# Patient Record
Sex: Female | Born: 1965 | Hispanic: No | Smoking: Former smoker
Health system: Southern US, Community
[De-identification: ages and names within clinical notes are randomized; demographics above are authoritative.]

## PROBLEM LIST (undated history)

## (undated) DIAGNOSIS — I82409 Acute embolism and thrombosis of unspecified deep veins of unspecified lower extremity: Secondary | ICD-10-CM

## (undated) DIAGNOSIS — D649 Anemia, unspecified: Secondary | ICD-10-CM

## (undated) HISTORY — PX: APPENDECTOMY: SHX54

---

## 2008-02-05 ENCOUNTER — Emergency Department (HOSPITAL_COMMUNITY): Admission: EM | Admit: 2008-02-05 | Discharge: 2008-02-05 | Payer: Self-pay | Admitting: Emergency Medicine

## 2008-04-25 DIAGNOSIS — Z86718 Personal history of other venous thrombosis and embolism: Secondary | ICD-10-CM

## 2008-04-26 ENCOUNTER — Inpatient Hospital Stay (HOSPITAL_COMMUNITY): Admission: EM | Admit: 2008-04-26 | Discharge: 2008-05-01 | Payer: Self-pay | Admitting: Emergency Medicine

## 2008-05-11 ENCOUNTER — Ambulatory Visit: Payer: Self-pay | Admitting: Nurse Practitioner

## 2008-05-11 DIAGNOSIS — D649 Anemia, unspecified: Secondary | ICD-10-CM | POA: Insufficient documentation

## 2008-05-11 DIAGNOSIS — Z8672 Personal history of thrombophlebitis: Secondary | ICD-10-CM | POA: Insufficient documentation

## 2008-05-12 LAB — CONVERTED CEMR LAB
Basophils Absolute: 0 10*3/uL (ref 0.0–0.1)
Eosinophils Absolute: 0.1 10*3/uL (ref 0.0–0.7)
Eosinophils Relative: 2 % (ref 0–5)
HCT: 34.6 % — ABNORMAL LOW (ref 36.0–46.0)
Hemoglobin: 11.1 g/dL — ABNORMAL LOW (ref 12.0–15.0)
Lymphocytes Relative: 42 % (ref 12–46)
MCV: 88.5 fL (ref 78.0–100.0)
Monocytes Absolute: 0.3 10*3/uL (ref 0.1–1.0)
Platelets: 293 10*3/uL (ref 150–400)
Prothrombin Time: 13.5 s (ref 11.6–15.2)
RDW: 14.9 % (ref 11.5–15.5)

## 2008-05-25 ENCOUNTER — Ambulatory Visit: Payer: Self-pay | Admitting: Nurse Practitioner

## 2008-05-26 LAB — CONVERTED CEMR LAB
INR: 1 (ref 0.0–1.5)
Prothrombin Time: 13.6 s (ref 11.6–15.2)

## 2008-06-27 ENCOUNTER — Telehealth (INDEPENDENT_AMBULATORY_CARE_PROVIDER_SITE_OTHER): Payer: Self-pay | Admitting: Nurse Practitioner

## 2008-07-04 ENCOUNTER — Encounter (INDEPENDENT_AMBULATORY_CARE_PROVIDER_SITE_OTHER): Payer: Self-pay | Admitting: *Deleted

## 2008-07-10 ENCOUNTER — Ambulatory Visit: Payer: Self-pay | Admitting: Nurse Practitioner

## 2008-07-10 DIAGNOSIS — F4322 Adjustment disorder with anxiety: Secondary | ICD-10-CM | POA: Insufficient documentation

## 2008-07-10 DIAGNOSIS — R350 Frequency of micturition: Secondary | ICD-10-CM | POA: Insufficient documentation

## 2008-07-10 LAB — CONVERTED CEMR LAB
Bilirubin Urine: NEGATIVE
Blood in Urine, dipstick: NEGATIVE
Ketones, urine, test strip: NEGATIVE
Urobilinogen, UA: NEGATIVE
pH: 7.5

## 2008-07-11 ENCOUNTER — Encounter (INDEPENDENT_AMBULATORY_CARE_PROVIDER_SITE_OTHER): Payer: Self-pay | Admitting: Nurse Practitioner

## 2008-07-13 ENCOUNTER — Encounter (INDEPENDENT_AMBULATORY_CARE_PROVIDER_SITE_OTHER): Payer: Self-pay | Admitting: Nurse Practitioner

## 2008-07-21 ENCOUNTER — Ambulatory Visit: Payer: Self-pay | Admitting: Oncology

## 2008-08-11 ENCOUNTER — Ambulatory Visit: Payer: Self-pay | Admitting: Internal Medicine

## 2008-08-30 ENCOUNTER — Telehealth (INDEPENDENT_AMBULATORY_CARE_PROVIDER_SITE_OTHER): Payer: Self-pay | Admitting: Nurse Practitioner

## 2008-12-28 ENCOUNTER — Telehealth (INDEPENDENT_AMBULATORY_CARE_PROVIDER_SITE_OTHER): Payer: Self-pay | Admitting: Nurse Practitioner

## 2009-03-13 ENCOUNTER — Emergency Department (HOSPITAL_COMMUNITY): Admission: EM | Admit: 2009-03-13 | Discharge: 2009-03-13 | Payer: Self-pay | Admitting: Emergency Medicine

## 2009-12-19 ENCOUNTER — Ambulatory Visit: Payer: Self-pay | Admitting: Nurse Practitioner

## 2009-12-19 DIAGNOSIS — F1910 Other psychoactive substance abuse, uncomplicated: Secondary | ICD-10-CM | POA: Insufficient documentation

## 2009-12-19 DIAGNOSIS — A6000 Herpesviral infection of urogenital system, unspecified: Secondary | ICD-10-CM | POA: Insufficient documentation

## 2009-12-19 LAB — CONVERTED CEMR LAB
Glucose, Urine, Semiquant: NEGATIVE
Ketones, urine, test strip: NEGATIVE
Protein, U semiquant: NEGATIVE
Specific Gravity, Urine: 1.02
pH: 6.5

## 2009-12-20 ENCOUNTER — Encounter (INDEPENDENT_AMBULATORY_CARE_PROVIDER_SITE_OTHER): Payer: Self-pay | Admitting: Nurse Practitioner

## 2009-12-20 LAB — CONVERTED CEMR LAB
Albumin: 4.3 g/dL (ref 3.5–5.2)
Alkaline Phosphatase: 34 units/L — ABNORMAL LOW (ref 39–117)
CO2: 22 meq/L (ref 19–32)
Calcium: 9.2 mg/dL (ref 8.4–10.5)
Chloride: 107 meq/L (ref 96–112)
Glucose, Bld: 94 mg/dL (ref 70–99)
HCT: 35.6 % — ABNORMAL LOW (ref 36.0–46.0)
Hemoglobin: 11.9 g/dL — ABNORMAL LOW (ref 12.0–15.0)
LDL Cholesterol: 111 mg/dL — ABNORMAL HIGH (ref 0–99)
Lymphocytes Relative: 40 % (ref 12–46)
Lymphs Abs: 1.5 10*3/uL (ref 0.7–4.0)
Monocytes Absolute: 0.3 10*3/uL (ref 0.1–1.0)
Monocytes Relative: 8 % (ref 3–12)
Neutro Abs: 1.9 10*3/uL (ref 1.7–7.7)
Potassium: 5.3 meq/L (ref 3.5–5.3)
RBC: 4.12 M/uL (ref 3.87–5.11)
Sodium: 140 meq/L (ref 135–145)
Total Protein: 6.9 g/dL (ref 6.0–8.3)
Triglycerides: 49 mg/dL (ref <150)

## 2009-12-28 ENCOUNTER — Encounter (INDEPENDENT_AMBULATORY_CARE_PROVIDER_SITE_OTHER): Payer: Self-pay | Admitting: *Deleted

## 2010-02-26 ENCOUNTER — Ambulatory Visit: Payer: Self-pay | Admitting: Nurse Practitioner

## 2010-02-26 ENCOUNTER — Other Ambulatory Visit: Admission: RE | Admit: 2010-02-26 | Discharge: 2010-02-26 | Payer: Self-pay | Admitting: Internal Medicine

## 2010-02-26 DIAGNOSIS — D72819 Decreased white blood cell count, unspecified: Secondary | ICD-10-CM | POA: Insufficient documentation

## 2010-02-26 DIAGNOSIS — F329 Major depressive disorder, single episode, unspecified: Secondary | ICD-10-CM

## 2010-02-26 DIAGNOSIS — N926 Irregular menstruation, unspecified: Secondary | ICD-10-CM | POA: Insufficient documentation

## 2010-02-26 DIAGNOSIS — E669 Obesity, unspecified: Secondary | ICD-10-CM | POA: Insufficient documentation

## 2010-02-26 LAB — CONVERTED CEMR LAB
Bilirubin Urine: NEGATIVE
KOH Prep: NEGATIVE
Ketones, urine, test strip: NEGATIVE
Specific Gravity, Urine: 1.015
Urobilinogen, UA: 0.2

## 2010-02-27 LAB — CONVERTED CEMR LAB
Basophils Relative: 1 % (ref 0–1)
Eosinophils Absolute: 0.1 10*3/uL (ref 0.0–0.7)
MCHC: 33.4 g/dL (ref 30.0–36.0)
MCV: 84.2 fL (ref 78.0–100.0)
Neutrophils Relative %: 54 % (ref 43–77)
OCCULT 1: NEGATIVE
Platelets: 209 10*3/uL (ref 150–400)
WBC: 4.5 10*3/uL (ref 4.0–10.5)

## 2010-03-01 ENCOUNTER — Encounter (INDEPENDENT_AMBULATORY_CARE_PROVIDER_SITE_OTHER): Payer: Self-pay | Admitting: Nurse Practitioner

## 2010-03-13 ENCOUNTER — Telehealth (INDEPENDENT_AMBULATORY_CARE_PROVIDER_SITE_OTHER): Payer: Self-pay | Admitting: Nurse Practitioner

## 2010-03-15 ENCOUNTER — Encounter (INDEPENDENT_AMBULATORY_CARE_PROVIDER_SITE_OTHER): Payer: Self-pay | Admitting: *Deleted

## 2010-04-03 ENCOUNTER — Emergency Department (HOSPITAL_COMMUNITY)
Admission: EM | Admit: 2010-04-03 | Discharge: 2010-04-04 | Payer: Self-pay | Source: Home / Self Care | Admitting: Emergency Medicine

## 2010-09-19 ENCOUNTER — Ambulatory Visit: Payer: Self-pay | Admitting: Nurse Practitioner

## 2010-09-19 DIAGNOSIS — N760 Acute vaginitis: Secondary | ICD-10-CM | POA: Insufficient documentation

## 2010-09-19 LAB — CONVERTED CEMR LAB
Protein, U semiquant: NEGATIVE
Urobilinogen, UA: 0.2

## 2010-10-20 ENCOUNTER — Encounter: Payer: Self-pay | Admitting: Internal Medicine

## 2010-10-29 ENCOUNTER — Ambulatory Visit (HOSPITAL_COMMUNITY)
Admission: RE | Admit: 2010-10-29 | Discharge: 2010-10-29 | Payer: Self-pay | Source: Home / Self Care | Attending: Internal Medicine | Admitting: Internal Medicine

## 2010-10-29 NOTE — Letter (Signed)
Summary: *HSN Results Follow up  [facility]  [address] [place], [place] [address]   Phone: [phone]  Fax: [phone]      12/28/2009   Molly Goodman [address] [place], [place]  [address]   Dear  [name],                            ____S.Drinkard,FNP   ____D. Gore,FNP       ____B. McPherson,MD   ____V. Rankins,MD    ____E. Mulberry,MD    ____N. Daphine Deutscher, FNP  ____D. Reche Dixon, MD    ____K. Philipp Deputy, MD    ____Other     This letter is to inform you that your recent test(s):  _______Pap Smear    _______Lab Test     _______X-ray    _______ is within acceptable limits  ___X____ requires a medication change  _______ requires a follow-up lab visit  _______ requires a follow-up visit with your provider   Comments:  We have been trying to reach you at [phone].  Please contact the office at your earliest convenience.       _________________________________________________________ If you have any questions, please contact our office                     Sincerely,  Molly Goodman [facility]            Appended Document: Needs repeat UA Left message on answering machine for pt. to return call.  Pt. did not receive Rx for Cipro.  Will attempt to contact pt. for repeat UA.  Dutch Quint RN  Feb 14, 2010 9:52 AM  Noted n.martin,fnp  Feb 14, 2010  10:16 AM  Pt. states that she has no residual symptoms of UTI and sees no need for follow-up.  Declined repeat UA. Dutch Quint RN  Feb 15, 2010 2:38 PM    Clinical Lists Changes

## 2010-10-29 NOTE — Progress Notes (Signed)
Summary: Office Visit//DEPRESSION SCREENING  Office Visit//DEPRESSION SCREENING   Imported By: Arta Bruce 02/28/2010 11:02:19  _____________________________________________________________________  External Attachment:    Type:   Image     Comment:   External Document

## 2010-10-29 NOTE — Letter (Signed)
Summary: Generic Letter  [facility]  [address] [place], [place] [address]   Phone: [phone]  Fax: [phone]    03/15/2010  Molly Goodman [address] [place], [place]  [address]  Dear [name],  We have been unable to contact you by telephone.  Please call our office, at your earliest convenience, so that we may speak with you.  Sincerely,   Dutch Quint RN

## 2010-10-29 NOTE — Assessment & Plan Note (Signed)
Summary: Cystitis   Vital Signs:  Patient profile:   45 year old female LMP:     11/2009 Height:      69 inches Weight:      174.7 pounds BMI:     25.89 BSA:     1.95 Temp:     98.3 degrees F oral Pulse rate:   134 / minute Pulse rhythm:   regular Resp:     20 per minute BP sitting:   92 / 61  (left arm) Cuff size:   regular  Vitals Entered By: Levon Hedger (December 19, 2009 9:34 AM) CC: pt last seen in 2009.Marland KitchenMarland Kitchenpossible bladder infection....need meds for herpes outbreak.Marland KitchenMarland Kitchenpt has not taken lovanox for a long time now for blood clots Is Patient Diabetic? No Pain Assessment Patient in pain? no       Does patient need assistance? Functional Status Self care Ambulation Normal Comments pt is not taking any medications daily. LMP (date): 11/2009     Enter LMP: 11/2009   CC:  pt last seen in 2009.Marland KitchenMarland Kitchenpossible bladder infection....need meds for herpes outbreak.Marland KitchenMarland Kitchenpt has not taken lovanox for a long time now for blood clots.  History of Present Illness:  Pt into the office for follow up.  Pt has not been in the ER or urgent care recently. No PCP since her last visit here in 2009.  Urine problems - 2 weeks ago she has noticed that her urine is very dark and discolored. Admits that she does not drink much water minimal dysuria no hematuria Monthly monthly periods.  Last menses last week.  S/p Tubal ligation. Hx of herpes - No current prophylactic meds.   Previous outbreaks every 2 years.  Now she has noticed that she is having more frequent flares.  Stress levels have also increased. Abstinent since 06/2009  Hx of drug use - Despite several setbacks pt has been clean and sober since 07/2009.  Hx DVT and PE in 2009.  Pt did not take lovenox as ordered. Prior to last episode she was on coumadin and was again non-complaint. She has not taken any anti-coagulation medication in over 1 year.  no further problems.  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 Other family members have a history of blood clots as well.  She was not able to get a greenfield filter in 2009 due to enlarged IVC and risk for filter migration (also using cocaine at that time)   Quit Cocaine use in 07/2009  Social - pt is living in her own apartment. She has both of her children - one is a Printmaker in college and the other is 43 yrs old. She is currently unemployed.  Fasting today  Habits & Providers  Alcohol-Tobacco-Diet     Alcohol drinks/day: <1     Alcohol type: wine     Tobacco Status: never  Exercise-Depression-Behavior     Does Patient Exercise: no     Have you felt down or hopeless? no     Have you felt little pleasure in things? no     Depression Counseling: not indicated; screening negative for depression     Drug Use: past     Drug Use Counseling: quit november 2010     Seat Belt Use: 100     Sun Exposure: occasionally  Allergies (verified): No Known Drug Allergies  Social History: Drug Use:  past  Review of Systems CV:  Denies chest pain or discomfort. Resp:  Denies cough. GI:  Denies abdominal pain, nausea, and vomiting. GU:  Complains of dysuria; denies discharge, incontinence, and nocturia.  Physical Exam  General:  alert.   Head:  normocephalic.   Lungs:  normal breath sounds.   Heart:  normal rate and regular rhythm.   Abdomen:  normal bowel sounds.   Msk:  normal ROM.   Neurologic:  alert & oriented X3 and cranial nerves II-XII intact.     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 s/p 3 episodes so would have likely been a lifelong candidate but given history of non- compliance with drug use she has not had any meds in over 1 year. She needs referral to hematology for hypercoagulable workup - will discuss on next visit The following medications were removed from the medication list:    Lovenox 60 Mg/0.2ml Soln (Enoxaparin sodium) .Marland Kitchen... 1 injection subcutaneously daily for 9 months  Complete Medication List: 1)  Macrobid 100 Mg Caps (Nitrofurantoin monohyd macro) .Marland Kitchen.. 1 capsule by mouth two times a day for infection 2)  Valacyclovir Hcl 500 Mg Tabs (Valacyclovir hcl) .... One tablet by mouth daily  Other Orders: T-Lipid Profile ([id number]) T-TSH [phone])  Patient Instructions: 1)  Schedule an office visit for a complete physical exam. 2)  You will be notified of any abnormal labs 3)  Your urine will be sent for culture and you will be notified of the results.  Take macrobid 100mg  by mouth two times a day. 4)  Drink plenty of water. 5)  Discuss need for hematology referral for hypercoagulable workup Prescriptions: VALACYCLOVIR HCL 500 MG TABS (VALACYCLOVIR HCL) One tablet by mouth daily  #30 x 5   Entered and Authorized by:   Lehman Prom FNP   Signed by:   Lehman Prom FNP on 12/19/2009   Method used:   Print then Give to Patient   RxID:   [id number] MACROBID 100 MG CAPS (NITROFURANTOIN MONOHYD MACRO) 1 capsule by mouth two times a day for infection  #14 x 0   Entered and Authorized by:   Lehman Prom FNP   Signed by:   Lehman Prom FNP on 12/19/2009    Method used:   Print then Give to Patient   RxID:   [id number]   Laboratory Results   Urine Tests  Date/Time Received: December 19, 2009 9:50 AM   Routine Urinalysis   Color: lt. yellow Appearance: Clear Glucose: negative   (Normal Range: Negative) Bilirubin: negative   (Normal Range: Negative) Ketone: negative   (Normal Range: Negative) Spec. Gravity: 1.020   (Normal Range: 1.003-1.035) Blood: trace-lysed   (Normal Range: Negative) pH: 6.5   (Normal Range: 5.0-8.0) Protein: negative   (Normal Range: Negative) Urobilinogen: 0.2   (  Normal Range: 0-1) Nitrite: negative   (Normal Range: Negative) Leukocyte Esterace: negative   (Normal Range: Negative)       Appended Document: Cystitis  Laboratory Results  Date/Time Received: December 19, 2009 2:04 PM   Other Tests  Rapid HIV: negative

## 2010-10-29 NOTE — Progress Notes (Signed)
Summary: needs to pick up Rx  Phone Note Outgoing Call   Call placed by: Dutch Quint RN Call placed to: Patient Summary of Call: Did not pick up Rx for antibiotic for UTI -- needs to come in for a repeat UA to assess for need.  Left message with sister for pt. to return call. Dutch Quint RN  March 13, 2010 3:02 PM  Left message with daughter for pt. to return call.  Dutch Quint RN  March 14, 2010 10:33 AM  Unable to leave message for pt.  Letter sent.  Dutch Quint RN  March 15, 2010 12:20 PM

## 2010-10-29 NOTE — Assessment & Plan Note (Signed)
Summary: Complete Physical Exam   Vital Signs:  Patient profile:   45 year old female LMP:     12/2009 Weight:      180.0 pounds BMI:     26.68 BSA:     1.98 Temp:     98.4 degrees F oral Pulse rate:   66 / minute Pulse rhythm:   regular Resp:     20 per minute BP sitting:   104 / 68  (left arm) Cuff size:   regular  Vitals Entered By: Levon Hedger (Feb 26, 2010 11:44 AM)  Nutrition Counseling: Patient's BMI is greater than 25 and therefore counseled on weight management options. CC: Cpp, Depression Is Patient Diabetic? No Pain Assessment Patient in pain? no       Does patient need assistance? Functional Status Self care Ambulation Normal Comments mammogram scheduled for 03/06/10 @ 9:50 pt informed. LMP (date): 12/2009     Enter LMP: 12/2009   CC:  Cpp and Depression.  History of Present Illness:   PAP - last done over 2 year ago - all normal results No family history of cervical or ovarian cancer Menses - skipped last month but usually monthly  2 teenage children No current birth control due to hx of DVT Not sexually active at this time; currently separted from husband  Mammogram - maternal grandmother with breast cancer never had mammogram  Optho - wears glasses but does not wear like she should. Last eye exam 2 years ago  Dental - no recent dental exam but is aware that she need to make the appt     Depression History:      The patient presents with symptoms of depression which have been present for greater than two weeks.  The patient is having a depressed mood most of the day and has a diminished interest in her usual daily activities.  Positive alarm features for depression include fatigue (loss of energy).  However, she denies recurrent thoughts of death or suicide.        Psychosocial stress factors include major life changes.  The patient denies that she feels like life is not worth living, denies that she wishes that she were dead, and denies that  she has thought about ending her life.         Habits & Providers  Alcohol-Tobacco-Diet     Alcohol drinks/day: <1     Alcohol type: wine     Tobacco Status: never  Exercise-Depression-Behavior     Does Patient Exercise: no     Have you felt down or hopeless? yes     Have you felt little pleasure in things? yes     Depression Counseling: not indicated; screening negative for depression     Drug Use: past     Seat Belt Use: 100     Sun Exposure: occasionally  Comments: PHQ-9 score = 19  Medications Prior to Update: 1)  Macrobid 100 Mg Caps (Nitrofurantoin Monohyd Macro) .Marland Kitchen.. 1 Capsule By Mouth Two Times A Day For Infection 2)  Valacyclovir Hcl 500 Mg Tabs (Valacyclovir Hcl) .... One Tablet By Mouth Daily 3)  Multivitamins  Tabs (Multiple Vitamin) .... One Tablet By Mouth Daily  Allergies (verified): No Known Drug Allergies  Review of Systems General:  Denies fever. Eyes:  Denies blurring. ENT:  Denies earache. CV:  Denies chest pain or discomfort. Resp:  Denies cough. GI:  Denies abdominal pain, nausea, and vomiting. GU:  Denies discharge. MS:  Denies joint pain.  Derm:  Denies rash. Neuro:  Denies headaches. Psych:  Complains of depression.  Physical Exam  General:  alert.   Head:  normocephalic.   Eyes:  pupils round.   Ears:  minimal cerumenear piercing(s) noted.   Nose:  no nasal discharge.   Mouth:  pharynx pink and moist.   Neck:  supple.   Chest Wall:  no deformities.   Breasts:  skin/areolae normal and no masses.   Lungs:  normal breath sounds.   Heart:  normal rate and regular rhythm.   Abdomen:  soft, non-tender, and normal bowel sounds.   Rectal:  external hemorrhoid(s).    Pelvic Exam  Vulva:      normal appearance.   Urethra and Bladder:      Urethra--no discharge.   Vagina:      physiologic discharge.   Cervix:      midposition, nabothian cysts.   Uterus:      smooth.   Adnexa:      nontender bilaterally.   Rectum:      heme  negative stool, + external hemorrhoids.      Impression & Recommendations:  Problem # 1:  ROUTINE GYNECOLOGICAL EXAMINATION (ICD-V72.31) labs reviewed from previous visit PAP done rec optho and dental exam tdap given EKG done Orders: KOH/ WET Mount [phone]) Pap Smear, Thin Prep ( Collection of) (J8119) UA Dipstick w/o Micro (manual) (14782) Hemoccult Guaiac-1 spec.(in office) (82270) T- GC Chlamydia ([address])  Problem # 2:  UNSPECIFIED BREAST SCREENING (ICD-V76.10) mammogram ordered self breast exam encouraged Orders: Mammogram (Screening) (Mammo)  Problem # 3:  LEUKOPENIA, MILD (ICD-288.50) will recheck today previous hx of ETOH abuse but not currenty Orders: T-CBC w/Diff ([id number])  Problem # 4:  IRREGULAR MENSES (ICD-626.4) advised pt to monitor menses urine pregnancy negative today Orders: Urine Pregnancy Test  (62952)  Problem # 5:  DEPRESSION, MILD (ICD-311) PHQ-9 score = 19  will refer to LCSW Orders: Misc. Referral (Misc. Ref)  Problem # 6:  OBESITY (ICD-278.00) advised pt of BMI need to increase physical activity  Complete Medication List: 1)  Valacyclovir Hcl 500 Mg Tabs (Valacyclovir hcl) .... One tablet by mouth daily 2)  Multivitamins Tabs (Multiple vitamin) .... One tablet by mouth daily  Other Orders: Tdap => 22yrs IM (84132) Admin 1st Vaccine (44010) Admin 1st Vaccine [facility]) [phone])  Patient Instructions: 1)  You will be notified of any abnormal results. 2)  Your blood was rechecked because on last visit your white blood cells were low.  This will be monitored, it may be due to past history of alcohol abuse 3)  Mammogram - keep scheduled appointment 4)  Mood/Depression - Feel free to schedule an appointment with counselor on staff here - Aquilla Solian.  She may be able to help you talk through some of your stressors 5)  Follow up as needed  Laboratory Results   Urine Tests  Date/Time Received: Feb 26, 2010 12:03 PM   Routine  Urinalysis   Color: lt. yellow Appearance: Clear Glucose: negative   (Normal Range: Negative) Bilirubin: negative   (Normal Range: Negative) Ketone: negative   (Normal Range: Negative) Spec. Gravity: 1.015   (Normal Range: 1.003-1.035) Blood: trace-lysed   (Normal Range: Negative) pH: 6.5   (Normal Range: 5.0-8.0) Protein: negative   (Normal Range: Negative) Urobilinogen: 0.2   (Normal Range: 0-1) Nitrite: negative   (Normal Range: Negative) Leukocyte Esterace: negative   (Normal Range: Negative)      Wet Mount/KOH Source: vaginal WBC/hpf: 1-5 Bacteria/hpf:  rare Clue cells/hpf: none Yeast/hpf: none Trichomonas/hpf: none  Stool - Occult Blood Hemmoccult #1: negative Date: 02/27/2010    Tetanus/Td Vaccine    Vaccine Type: Tdap    Site: left deltoid    Mfr: Sanofi Pasteur    Dose: 0.5 ml    Route: IM    Given by: Levon Hedger    Exp. Date: 07/31/2012    Lot #: [id number]    VIS given: 08/17/07 version given Feb 26, 2010.  Laboratory Results   Urine Tests    Routine Urinalysis   Color: lt. yellow Appearance: Clear Glucose: negative   (Normal Range: Negative) Bilirubin: negative   (Normal Range: Negative) Ketone: negative   (Normal Range: Negative) Spec. Gravity: 1.015   (Normal Range: 1.003-1.035) Blood: trace-lysed   (Normal Range: Negative) pH: 6.5   (Normal Range: 5.0-8.0) Protein: negative   (Normal Range: Negative) Urobilinogen: 0.2   (Normal Range: 0-1) Nitrite: negative   (Normal Range: Negative) Leukocyte Esterace: negative   (Normal Range: Negative)      Wet Mount Wet Mount KOH: Negative    Prevention & Chronic Care Immunizations   Influenza vaccine: Not documented    Tetanus booster: 02/26/2010: Tdap    Pneumococcal vaccine: Not documented  Other Screening   Pap smear: Not documented   Pap smear action/deferral: Ordered  (02/26/2010)   Pap smear due: 02/27/2011    Mammogram: Not documented   Mammogram action/deferral: Ordered   (02/26/2010)   Smoking status: never  (02/26/2010)  Lipids   Total Cholesterol: 203  (12/19/2009)   LDL: 111  (12/19/2009)   LDL Direct: Not documented   HDL: 82  (12/19/2009)   Triglycerides: 49  (12/19/2009)

## 2010-10-29 NOTE — Letter (Signed)
Summary: *HSN Results Follow up  [facility]  [address] [place], [place] [address]   Phone: [phone]  Fax: [phone]      03/01/2010   Tyree A Lacross [address] [place], [place]  [address]   Dear  Ms. Raziya Patin,                            ____S.Drinkard,FNP   ____D. Gore,FNP       ____B. McPherson,MD   ____V. Rankins,MD    ____E. Mulberry,MD    _X___N. Daphine Deutscher, FNP  ____D. Reche Dixon, MD    ____K. Philipp Deputy, MD    ____Other     This letter is to inform you that your recent test(s):  ___X____Pap Smear    ___X___Lab Test     _______X-ray    ___X____ is within acceptable limits  _______ requires a medication change  _______ requires a follow-up lab visit  _______ requires a follow-up visit with your provider    Comments: Labs and Pap done during recent office visit are normal.       _________________________________________________________ If you have any questions, please contact our office [phone].                    Sincerely,    Lehman Prom FNP [facility]

## 2010-10-29 NOTE — Letter (Signed)
Summary: Lipid Letter  [facility]  [address] [place], [place] [address]   Phone: [phone]  Fax: [phone]    12/20/2009  [facility] [address] [place], [place]  [address]  Dear Gershon Cull:  We have carefully reviewed your last lipid profile from 12/19/2009 and the results are noted below with a summary of recommendations for lipid management.    Cholesterol:       203     Goal: less than 200   HDL "good" Cholesterol:   82     Goal: greater than 40   LDL "bad" Cholesterol:   111     Goal: less than 130   Triglycerides:       49     Goal: less than 150    Labs done during recent office visit show your cholesterol is slightly elevated. No need for medication at this time. Just monitor fried and fatty foods. You are also slightly anemic.  You should have been contacted by this office regarding the needed to start a multivitamin.     Current Medications: 1)    Macrobid 100 Mg Caps (Nitrofurantoin monohyd macro) .Marland Kitchen.. 1 capsule by mouth two times a day for infection 2)    Valacyclovir Hcl 500 Mg Tabs (Valacyclovir hcl) .... One tablet by mouth daily 3)    Multivitamins  Tabs (Multiple vitamin) .... One tablet by mouth daily  If you have any questions, please call. We appreciate being able to work with you.   Sincerely,    [facility] Lehman Prom FNP

## 2010-10-31 NOTE — Assessment & Plan Note (Signed)
Summary: Acute - Bacterial Vaginosis   Vital Signs:  Patient profile:   45 year old female Menstrual status:  regular LMP:     09/12/2010 Height:      69 inches Temp:     98.8 degrees F oral Pulse rate:   72 / minute Pulse rhythm:   regular Resp:     18 per minute BP sitting:   90 / 60  (left arm) Cuff size:   regular  Vitals Entered By: Levon Hedger (September 19, 2010 4:27 PM) CC: Vaginal discharge LMP (date): 09/12/2010 LMP - Character: normal    Menses interval (days): 28 Menstrual flow (days): 5-7 Menstrual Status regular Enter LMP: 09/12/2010 Last PAP Result  Specimen Adequacy: Satisfactory for evaluation.   Interpretation/Result:Negative for intraepithelial Lesion or Malignancy.      CC:  Vaginal discharge.  History of Present Illness:  Pt into the office for f/u She reports that she went to Rehab back in June and was released in August. She has been clean and sober for 6 months. Congrats to pt She is living with her 2 children  Vaginal discharge      This is a 45 year old woman who presents with Vaginal discharge.  The symptoms began 2 months ago.  The severity is described as moderate.  The patient complains of itching and burning on urination, but denies frequency and urgency.  The discharge is described as malodorous.  Associated symptoms include genital sores.   Menstural cycle is monthtly. lasts for 5-7 days.  Allergies: No Known Drug Allergies  Review of Systems CV:  Denies chest pain or discomfort. Resp:  Denies cough. GI:  Complains of abdominal pain; denies nausea and vomiting. GU:  Complains of discharge, dysuria, and urinary frequency.  Physical Exam  General:  alert.   Head:  normocephalic.   Lungs:  normal breath sounds.   Heart:  normal rate.   Abdomen:  normal bowel sounds.   Msk:  normal ROM.   Neurologic:  alert & oriented X3.   Skin:  color normal.   Psych:  Oriented X3.     Impression & Recommendations:  Problem # 1:   VAGINITIS, BACTERIAL (ICD-616.10)  Her updated medication list for this problem includes:    Metrogel-vaginal 0.75 % Gel (Metronidazole) ..... One applicator intravaginally at night  Problem # 2:  NEED PROPHYLACTIC VACCINATION&INOCULATION FLU (ICD-V04.81) given today  Problem # 3:  DEEP VENOUS THROMBOPHLEBITIS, HX OF (ICD-V12.52)  Orders: Hematology Referral (Hematology)  Complete Medication List: 1)  Valacyclovir Hcl 500 Mg Tabs (Valacyclovir hcl) .... One tablet by mouth daily for suppression 2)  Multivitamins Tabs (Multiple vitamin) .... One tablet by mouth daily 3)  Metrogel-vaginal 0.75 % Gel (Metronidazole) .... One applicator intravaginally at night  Other Orders: Mammogram (Screening) (Mammo)  Patient Instructions: 1)  You  have been given the flu vaccine today. 2)  Your mammogram will be scheduled and you will be informed of the time/date of the appointment 3)  You will also be referred to the hematologist to check on your blood disorder.  You will be notified of the appointment time/date Prescriptions: VALACYCLOVIR HCL 500 MG TABS (VALACYCLOVIR HCL) One tablet by mouth daily for suppression  #30 x 5   Entered and Authorized by:   Lehman Prom FNP   Signed by:   Lehman Prom FNP on 09/19/2010   Method used:   Print then Give to Patient   RxID:   [id number] METROGEL-VAGINAL 0.75 % GEL (METRONIDAZOLE) One  applicator intravaginally at night  #45gm x 0   Entered and Authorized by:   Lehman Prom FNP   Signed by:   Lehman Prom FNP on 09/19/2010   Method used:   Print then Give to Patient   RxID:   [id number] METROGEL-VAGINAL 0.75 % GEL (METRONIDAZOLE) One applicator intravaginally at night  #45gm x 0   Entered and Authorized by:   Lehman Prom FNP   Signed by:   Lehman Prom FNP on 09/19/2010   Method used:   Print then Give to Patient   RxID:   [id number] VALACYCLOVIR HCL 500 MG TABS (VALACYCLOVIR HCL) One tablet by mouth daily for  suppression  #30 x 5   Entered and Authorized by:   Lehman Prom FNP   Signed by:   Lehman Prom FNP on 09/19/2010   Method used:   Print then Give to Patient   RxID:   [id number]    Orders Added: 1)  Est. Patient Level III [33295] 2)  Mammogram (Screening) [Mammo] 3)  Hematology Referral [Hematology]    Laboratory Results   Urine Tests  Date/Time Received: September 19, 2010 4:46 PM   Routine Urinalysis   Color: yellow Appearance: Hazy Glucose: negative   (Normal Range: Negative) Bilirubin: negative   (Normal Range: Negative) Ketone: negative   (Normal Range: Negative) Spec. Gravity: >=1.030   (Normal Range: 1.003-1.035) Blood: negative   (Normal Range: Negative) pH: 7.0   (Normal Range: 5.0-8.0) Protein: negative   (Normal Range: Negative) Urobilinogen: 0.2   (Normal Range: 0-1) Nitrite: negative   (Normal Range: Negative) Leukocyte Esterace: large   (Normal Range: Negative)    Date/Time Received: September 19, 2010 5:29 PM   [place]et [place]/KOH Source: vaginal WBC/hpf: 1-5 Bacteria/hpf: rare Clue cells/hpf: few Yeast/hpf: none Trichomonas/hpf: none    Prevention & Chronic Care Immunizations   Influenza vaccine: Not documented    Tetanus booster: 02/26/2010: Tdap    Pneumococcal vaccine: Not documented  Other Screening   Pap smear:  Specimen Adequacy: Satisfactory for evaluation.   Interpretation/Result:Negative for intraepithelial Lesion or Malignancy.     (02/26/2010)   Pap smear action/deferral: Ordered  (02/26/2010)   Pap smear due: 02/2011    Mammogram: Not documented   Mammogram action/deferral: Ordered  (02/26/2010)   Smoking status: never  (02/26/2010)  Lipids   Total Cholesterol: 203  (12/19/2009)   LDL: 111  (12/19/2009)   LDL Direct: Not documented   HDL: 82  (12/19/2009)   Triglycerides: 49  (12/19/2009)   Nursing Instructions: Give Flu vaccine today    Laboratory Results   Urine Tests    Routine Urinalysis     Color: yellow Appearance: Hazy Glucose: negative   (Normal Range: Negative) Bilirubin: negative   (Normal Range: Negative) Ketone: negative   (Normal Range: Negative) Spec. Gravity: >=1.030   (Normal Range: 1.003-1.035) Blood: negative   (Normal Range: Negative) pH: 7.0   (Normal Range: 5.0-8.0) Protein: negative   (Normal Range: Negative) Urobilinogen: 0.2   (Normal Range: 0-1) Nitrite: negative   (Normal Range: Negative) Leukocyte Esterace: large   (Normal Range: Negative)      Wet Mount Wet Mount KOH: Negative   Appended Document: Acute - Bacterial Vaginosis     Allergies: No Known Drug Allergies   Complete Medication List: 1)  Valacyclovir Hcl 500 Mg Tabs (Valacyclovir hcl) .... One tablet by mouth daily for suppression 2)  Multivitamins Tabs (Multiple vitamin) .... One tablet by mouth daily 3)  Metrogel-vaginal 0.75 % Gel (Metronidazole) .... One  applicator intravaginally at night  Other Orders: Flu Vaccine 39yrs + (40981) Admin 1st Vaccine (19147)   Orders Added: 1)  Flu Vaccine 73yrs + [82956] 2)  Admin 1st Vaccine [21308]   Immunizations Administered:  Influenza Vaccine # 1:    Vaccine Type: Fluvax 3+    Site: left deltoid    Mfr: GlaxoSmithKline    Dose: 0.5 ml    Route: IM    Given by: Levon Hedger    Exp. Date: 03/29/2011    Lot #: [id number]    VIS given: 04/23/10 version given September 20, 2010.  Flu Vaccine Consent Questions:    Do you have a history of severe allergic reactions to this vaccine? no    Any prior history of allergic reactions to egg and/or gelatin? no    Do you have a sensitivity to the preservative Thimersol? no    Do you have a past history of Guillan-Barre Syndrome? no    Do you currently have an acute febrile illness? no    Have you ever had a severe reaction to latex? no    Vaccine information given and explained to patient? yes    Are you currently pregnant? no    ndc  [phone]  Immunizations  Administered:  Influenza Vaccine # 1:    Vaccine Type: Fluvax 3+    Site: left deltoid    Mfr: GlaxoSmithKline    Dose: 0.5 ml    Route: IM    Given by: Levon Hedger    Exp. Date: 03/29/2011    Lot #: [id number]    VIS given: 04/23/10 version given September 20, 2010.

## 2010-11-14 NOTE — Letter (Signed)
Summary: SOCIAL WORK//NO SHOW  SOCIAL WORK//NO SHOW   Imported By: Arta Bruce 11/05/2010 10:26:15  _____________________________________________________________________  External Attachment:    Type:   Image     Comment:   External Document

## 2010-12-15 LAB — URINE CULTURE: Colony Count: >=100000

## 2010-12-15 LAB — COMPREHENSIVE METABOLIC PANEL
ALT: 21 U/L (ref 0–35)
Alkaline Phosphatase: 41 U/L (ref 39–117)
CO2: 27 mEq/L (ref 19–32)
Calcium: 9 mg/dL (ref 8.4–10.5)
Chloride: 105 mEq/L (ref 96–112)
GFR calc non Af Amer: >60 mL/min (ref >60)
Glucose, Bld: 104 mg/dL — ABNORMAL HIGH (ref 70–99)
Potassium: 4 mEq/L (ref 3.5–5.1)
Sodium: 137 mEq/L (ref 135–145)
Total Bilirubin: 0.9 mg/dL (ref 0.3–1.2)

## 2010-12-15 LAB — CBC
HCT: 35 % — ABNORMAL LOW (ref 36.0–46.0)
Hemoglobin: 11.8 g/dL — ABNORMAL LOW (ref 12.0–15.0)
MCHC: 33.7 g/dL (ref 30.0–36.0)
RBC: 4.02 MIL/uL (ref 3.87–5.11)

## 2010-12-15 LAB — RAPID URINE DRUG SCREEN, HOSP PERFORMED
Barbiturates: NOT DETECTED
Opiates: NOT DETECTED
Tetrahydrocannabinol: NOT DETECTED

## 2010-12-15 LAB — URINALYSIS, ROUTINE W REFLEX MICROSCOPIC
Bilirubin Urine: NEGATIVE
Nitrite: POSITIVE — AB
Specific Gravity, Urine: 1.015 (ref 1.005–1.030)
pH: 6.5 (ref 5.0–8.0)

## 2010-12-15 LAB — WET PREP, GENITAL: Yeast Wet Prep HPF POC: NONE SEEN

## 2010-12-15 LAB — DIFFERENTIAL
Basophils Absolute: 0 10*3/uL (ref 0.0–0.1)
Basophils Relative: 0 % (ref 0–1)
Eosinophils Absolute: 0 10*3/uL (ref 0.0–0.7)
Eosinophils Relative: 0 % (ref 0–5)
Neutrophils Relative %: 83 % — ABNORMAL HIGH (ref 43–77)

## 2010-12-15 LAB — URINE MICROSCOPIC-ADD ON

## 2011-01-06 LAB — URINALYSIS, ROUTINE W REFLEX MICROSCOPIC
Bilirubin Urine: NEGATIVE
Hgb urine dipstick: NEGATIVE
Ketones, ur: NEGATIVE mg/dL
Nitrite: POSITIVE — AB
Urobilinogen, UA: 1 mg/dL (ref 0.0–1.0)

## 2011-01-06 LAB — DIFFERENTIAL
Basophils Absolute: 0.1 10*3/uL (ref 0.0–0.1)
Basophils Relative: 1 % (ref 0–1)
Neutro Abs: 1.8 10*3/uL (ref 1.7–7.7)
Neutrophils Relative %: 44 % (ref 43–77)

## 2011-01-06 LAB — URINE MICROSCOPIC-ADD ON

## 2011-01-06 LAB — RAPID URINE DRUG SCREEN, HOSP PERFORMED
Amphetamines: NOT DETECTED
Cocaine: POSITIVE — AB
Opiates: NOT DETECTED
Tetrahydrocannabinol: NOT DETECTED

## 2011-01-06 LAB — POCT I-STAT, CHEM 8
Creatinine, Ser: 0.8 mg/dL (ref 0.4–1.2)
Glucose, Bld: 89 mg/dL (ref 70–99)
Hemoglobin: 12.2 g/dL (ref 12.0–15.0)
Potassium: 4.7 mEq/L (ref 3.5–5.1)

## 2011-01-06 LAB — POCT CARDIAC MARKERS
CKMB, poc: <1 ng/mL — ABNORMAL LOW (ref 1.0–8.0)
Myoglobin, poc: 20.4 ng/mL (ref 12–200)

## 2011-01-06 LAB — CBC
MCHC: 33.5 g/dL (ref 30.0–36.0)
RDW: 13.9 % (ref 11.5–15.5)

## 2011-01-06 LAB — URINE CULTURE: Colony Count: >=100000

## 2011-02-11 NOTE — Discharge Summary (Signed)
NAMESAROYA, RICCOBONO            ACCOUNT NO.:  [id number]   MEDICAL RECORD NO.:  [id number]          PATIENT TYPE:  INP   LOCATION:  1443                         FACILITY:  [facility]   PHYSICIAN:  Lucita Ferrara, MD         DATE OF BIRTH:  04-Aug-1966   DATE OF ADMISSION:  04/25/2008  DATE OF DISCHARGE:                               DISCHARGE SUMMARY   No dictation.      Lucita Ferrara, MD     RR/MEDQ  D:  04/30/2008  T:  04/30/2008  Job:  [id number]

## 2011-02-11 NOTE — Discharge Summary (Signed)
Molly Goodman, Molly Goodman            ACCOUNT NO.:  [id number]   MEDICAL RECORD NO.:  [id number]         PATIENT TYPE:  LINP   LOCATION:                               FACILITY:  [facility]   PHYSICIAN:  Lucita Ferrara, MD         DATE OF BIRTH:  [birth year]   DATE OF ADMISSION:  04/26/2008  DATE OF DISCHARGE:  05/01/2008                               DISCHARGE SUMMARY      Lucita Ferrara, MD  Electronically Signed     RR/MEDQ  D:  04/30/2008  T:  04/30/2008  Job:  [id number]

## 2011-02-11 NOTE — H&P (Signed)
[name], Molly Goodman            ACCOUNT NO.:  [id number]   MEDICAL RECORD NO.:  [id number]          PATIENT TYPE:  INP   LOCATION:  1443                         FACILITY:  [facility]   PHYSICIAN:  Della Goo, M.D. DATE OF BIRTH:  1965-10-12   DATE OF ADMISSION:  04/26/2008  DATE OF DISCHARGE:                              HISTORY & PHYSICAL   PRIMARY CARE PHYSICIAN:  Unassigned.   CHIEF COMPLAINT:  Shortness of breath.   HISTORY OF PRESENT ILLNESS:  This is a 45 year old female who presented  to the emergency department secondary to complaints initially of  dizziness, shortness of breath and sore throat.  She was evaluated for  strep throat, and she also began to complain that she had been having  shortness of breath and pleuritic chest discomfort. She reported that  she had been having these symptoms off and on for 1 month.  She denies  having any fevers or chills.  Denies having any cough.   The patient underwent an evaluation and a chest x-ray and D-dimer were  performed.  Chest x-ray results were normal.  However, the D-dimer  returned elevated at a level of 2.67, and the patient was sent for a CT  angiogram of the chest, results of which returned positive for bilateral  pulmonary emboli..  The patient was referred for admission and upon  evaluation and interview of this patient, the patient denies having any  risk factors other than she previously had a DVT of the right lower  extremity in 2000, which was treated with Coumadin therapy.  The patient  denies knowledge of any other risk factors, however, she does report  that she has a sister who also had a deep venous thrombosis.  The  patient denies being on any oral contraceptive medication.   PAST MEDICAL HISTORY:  Significant for previous deep venous thrombosis.   PAST SURGICAL HISTORY:  History of an appendectomy.   MEDICATIONS:  None.   ALLERGIES:  NO KNOWN DRUG ALLERGIES.   SOCIAL HISTORY:  The patient reports  being a nonsmoker, nondrinker and  denies any illicit drug usage.   FAMILY HISTORY:  Positive for a sister with a deep venous thrombosis and  a history of hypertension in her maternal grandmother.  No history of  coronary artery disease or cancer in her family that she knows of.   REVIEW OF SYSTEMS:  Negative otherwise.   PHYSICAL EXAMINATION:  GENERAL:  This is a thin, well-nourished, well-  developed female in no acute distress.  VITAL SIGNS:  Temperature 99.0, blood pressure 110/80, heart rate 93,  respirations 18.  O2 saturations 100% on room air.  HEENT:  Normocephalic, atraumatic.  There is no scleral icterus.  Pupils  are equally round and reactive to light.  Extraocular movements are  intact.  Funduscopic benign.  Oropharynx is clear.  NECK:  Supple, full range of motion.  No thyromegaly, adenopathy or  jugulovenous distention.  CARDIOVASCULAR:  Regular rate and rhythm.  No  murmurs, gallops or rubs.  LUNGS:  Clear to auscultation bilaterally.  ABDOMEN:  Positive bowel sounds, soft, nontender, nondistended.  EXTREMITIES:  Without cyanosis, clubbing or edema.  NEUROLOGIC:  Nonfocal.   LABORATORY STUDIES:  D-dimer 2.67, group A strep test negative.  Chest x-  ray, no acute disease process.  CT angiogram study of the chest reveals  bilateral pulmonary emboli.   ASSESSMENT:  A 45 year old female being admitted with;  1. Bilateral pulmonary emboli.  2. Shortness of breath.  3. Pleuritic chest pain.   PLAN:  The patient will be admitted to a telemetry area for cardiac  monitoring.  Full-dose Lovenox therapy has been ordered along with the  Coumadin protocol.  The patient will be placed on supplemental oxygen as  needed along with nebulizer treatments.  GI prophylaxis has also been  ordered.      Della Goo, M.D.  Electronically Signed     HJ/MEDQ  D:  04/26/2008  T:  04/26/2008  Job:  [id number]

## 2011-02-11 NOTE — Discharge Summary (Signed)
Molly Goodman            ACCOUNT NO.:  [id number]   MEDICAL RECORD NO.:  [id number]          PATIENT TYPE:  INP   LOCATION:  1443                         FACILITY:  [facility]   PHYSICIAN:  Molly Ferrara, MD         DATE OF BIRTH:  [birth year]   DATE OF ADMISSION:  04/25/2008  DATE OF DISCHARGE:  05/01/2008                               DISCHARGE SUMMARY   DISCHARGE DIAGNOSES:  1. Bilateral lower lobe pulmonary emboli.  2. There was a history of deep vein thrombosis.  3. Active and current drug abuse including cocaine abuse,      noncompliance with medical treatment, high risk for Coumadin      therapy.  4. Stable anemia.  5. 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  6. History of bilateral tubal ligation.   CONSULTANTS:  1. Interventional radiology.  2. Hematology.   PROCEDURES:  1. The patient had a CT angiogram on April 17, 2008 which showed      bilateral lower lobe pulmonary emboli.  2. The patient had a chest x-ray April 26, 2008 which showed no acute      cardiopulmonary disease.   BRIEF HISTORY OF PRESENT ILLNESS:  The patient is a 45 year old female  who presented to the ED with dizziness, shortness of breath and sore  throat.  Her strep throat screen was  negative.  Her chest pain was  pleuritic  for chest pain was pleuritic and given that she had a history  of DVT of the right lower extremity, her noncompliance with her Coumadin  therapy and her family history of DVT  CT angiography was done in the  face of elevated D-dimer which showed pulmonary emboli.  The patient was  admitted to the medical floor and intermittently initiated on Coumadin  and heparin.  However, this was reevaluated given her  overall  compliance, active cocaine abuse and the risks of bleeding, intracranial  hemorrhage with Coumadin and concomitant cocaine abuse.  I made a phone  call to Dr. Cyndie Chime from hematology who recommended having the  patient sent home with proper follow-up with [facility] and to not  initiate any Coumadin, to continue with Lovenox.  Follow-up will be set  up with [facility].  The patient will not be discharged until there is  an appointment.   Issues to discuss with follow up appointments:  1. Compliance with medical treatment.  2. She may need hematology consultation for hypercoagulable workup      which was preliminarily done in the hospital.  3. Compliance with Lovenox and discretion of primary care physician,      once established, to initiate Coumadin.   MEDICATIONS:  The patient will be going home with Lovenox 60 mg subcu  daily,  prescription written.  Home health to monitor compliance.   CONDITION ON DISCHARGE:  Stable to ambulate without any chest pain,  shortness of breath.   PHYSICAL EXAMINATION:  VITAL SIGNS:  Temperature 98.4, pulse 90,  respirations 18, blood pressure is 92/48, pulse ox 100% on room air.  HEENT:  Normocephalic, atraumatic.  Sclerae anicteric.  NECK:  Supple.  No JVD, no carotid bruits.  CARDIOVASCULAR:  S1 and S2.  Regular rhythm.  No murmurs, rubs or  gallops.  LUNGS:  Clear to auscultation bilaterally.  No rhonchi, rales or  wheezes.  Note that I spoke to the patient's husband, Molly Goodman, area  code [phone].  The patient advised me and authorized me to explain  the hospitalization and without any restrictions on details, and I did  so.      Molly Ferrara, MD  Electronically Signed     RR/MEDQ  D:  04/30/2008  T:  04/30/2008  Job:  [phone] [id number]

## 2011-06-27 LAB — CBC
HCT: 30.1 — ABNORMAL LOW
Hemoglobin: 10.3 — ABNORMAL LOW
Hemoglobin: 11.9 — ABNORMAL LOW
MCHC: 34.5
MCV: 85.8
MCV: 85.4
Platelets: 181
RBC: 3.48 — ABNORMAL LOW
RBC: 4.01
WBC: 3.9 — ABNORMAL LOW
WBC: 3.4 — ABNORMAL LOW

## 2011-06-27 LAB — PROTIME-INR
INR: 1.1
INR: 1.3
INR: 1.6 — ABNORMAL HIGH
Prothrombin Time: 14.9
Prothrombin Time: 18.1 — ABNORMAL HIGH

## 2011-06-27 LAB — BASIC METABOLIC PANEL
CO2: 25
Calcium: 9
Chloride: 108
GFR calc Af Amer: >60
GFR calc Af Amer: >60
GFR calc non Af Amer: >60
Potassium: 3.9
Sodium: 138
Sodium: 141

## 2011-06-27 LAB — DIFFERENTIAL
Basophils Relative: 1
Monocytes Absolute: 0.2
Monocytes Relative: 6
Neutro Abs: 1.9

## 2011-06-27 LAB — ANTITHROMBIN III: AntiThromb III Func: 82

## 2011-06-27 LAB — HEPATIC FUNCTION PANEL
ALT: 13
Albumin: 3.5
Alkaline Phosphatase: 32 — ABNORMAL LOW
Total Bilirubin: 0.7

## 2011-06-27 LAB — CARDIOLIPIN ANTIBODIES, IGG, IGM, IGA: Anticardiolipin IgG: <7 — ABNORMAL LOW (ref <11)

## 2011-06-27 LAB — RAPID URINE DRUG SCREEN, HOSP PERFORMED
Amphetamines: NOT DETECTED
Barbiturates: NOT DETECTED
Tetrahydrocannabinol: NOT DETECTED

## 2011-06-27 LAB — LUPUS ANTICOAGULANT PANEL: Lupus Anticoagulant: NOT DETECTED

## 2011-06-27 LAB — HEMOGLOBIN A1C: Mean Plasma Glucose: 126

## 2011-08-18 ENCOUNTER — Encounter: Payer: Self-pay | Admitting: Emergency Medicine

## 2011-08-18 ENCOUNTER — Emergency Department (INDEPENDENT_AMBULATORY_CARE_PROVIDER_SITE_OTHER)
Admission: EM | Admit: 2011-08-18 | Discharge: 2011-08-18 | Disposition: A | Discharge status: Home / Self Care | Payer: Medicaid Other | Source: Home / Self Care | Attending: Family Medicine | Admitting: Family Medicine

## 2011-08-18 DIAGNOSIS — N760 Acute vaginitis: Secondary | ICD-10-CM

## 2011-08-18 HISTORY — DX: Anemia, unspecified: D64.9

## 2011-08-18 HISTORY — DX: Acute embolism and thrombosis of unspecified deep veins of unspecified lower extremity: I82.409

## 2011-08-18 LAB — POCT URINALYSIS DIP (DEVICE)
Bilirubin Urine: NEGATIVE
Glucose, UA: NEGATIVE mg/dL
Leukocytes, UA: NEGATIVE
Nitrite: NEGATIVE
Urobilinogen, UA: 0.2 mg/dL (ref 0.0–1.0)
pH: 5.5 (ref 5.0–8.0)

## 2011-08-18 LAB — WET PREP, GENITAL

## 2011-08-18 MED ORDER — METRONIDAZOLE 500 MG PO TABS: 500.0000 mg | ORAL_TABLET | Freq: Two times a day (BID) | ORAL | Status: AC

## 2011-08-18 MED ORDER — FLUCONAZOLE 150 MG PO TABS: 150.0000 mg | ORAL_TABLET | Freq: Once | ORAL | Status: AC

## 2011-08-18 NOTE — ED Notes (Signed)
Pt here with low back pain radiating around bilat flank area that started x 3 wks ago.sx constant,achy pain @ rest and activities.denies n/v or hematuria.pt was seen last June and diag with kidney infection and states the sx are the same.

## 2011-08-18 NOTE — ED Provider Notes (Signed)
History     CSN: [id number] Arrival date & time: 08/18/2011  9:14 AM   First MD Initiated Contact with Patient 08/18/11 0914      No chief complaint on file.   (Consider location/radiation/quality/duration/timing/severity/associated sxs/prior treatment) Patient is a 45 y.o. female presenting with vaginal discharge and back pain.  Vaginal Discharge This is a new problem. The current episode started more than 2 days ago. The problem occurs constantly. The problem has not changed since onset.Pertinent negatives include no abdominal pain. The symptoms are aggravated by nothing.  Back Pain  This is a new problem. The current episode started more than 1 week ago. The problem occurs constantly. The problem has not changed since onset.The pain is associated with no known injury. The pain is present in the lumbar spine. The quality of the pain is described as aching. The pain does not radiate. The pain is mild. The symptoms are aggravated by bending and twisting. Pertinent negatives include no fever, no abdominal pain, no bladder incontinence and no dysuria. She has tried nothing for the symptoms.    Past Medical History  Diagnosis Date  . DVT (deep venous thrombosis)   . Anemia     Past Surgical History  Procedure Date  . Appendectomy     History reviewed. No pertinent family history.  History  Substance Use Topics  . Smoking status: Current Everyday Smoker  . Smokeless tobacco: Not on file  . Alcohol Use: No    OB History    Grav Para Term Preterm Abortions TAB SAB Ect Mult Living                  Review of Systems  Constitutional: Negative.  Negative for fever.  HENT: Negative.   Eyes: Negative.   Respiratory: Negative.   Gastrointestinal: Negative for abdominal pain.  Genitourinary: Positive for vaginal discharge. Negative for bladder incontinence and dysuria.  Musculoskeletal: Positive for back pain.  Neurological: Negative.     Allergies  Review of patient's  allergies indicates not on file.  Home Medications  No current outpatient prescriptions on file.  BP 97/55  Pulse 68  Temp(Src) 98.5 F (36.9 C) (Oral)  Resp 16  SpO2 100%  LMP 08/11/2011  Physical Exam  Constitutional: She is oriented to person, place, and time. She appears well-developed and well-nourished.  HENT:  Head: Normocephalic and atraumatic.  Eyes: EOM are normal.  Neck: Normal range of motion.  Abdominal: Soft. There is no tenderness.  Genitourinary: There is no rash, tenderness or lesion on the right labia. There is no rash, tenderness or lesion on the left labia. Cervix exhibits no motion tenderness, no discharge and no friability. Right adnexum displays no mass and no tenderness. Left adnexum displays no mass and no tenderness. No tenderness around the vagina. Vaginal discharge found.  Musculoskeletal:       Lumbar back: She exhibits normal range of motion, no tenderness, no bony tenderness and no pain.       Back:  Neurological: She is alert and oriented to person, place, and time.  Skin: Skin is warm and dry.    ED Course  Procedures (including critical care time)   Labs Reviewed  POCT URINALYSIS DIPSTICK  POCT URINALYSIS DIPSTICK  POCT PREGNANCY, URINE   No results found.   No diagnosis found.    MDM  Thin, milky vaginal discharge noted on GU exam        Richardo Priest, MD 08/18/11 1000

## 2011-08-19 ENCOUNTER — Telehealth (HOSPITAL_COMMUNITY): Payer: Self-pay | Admitting: *Deleted

## 2011-08-19 LAB — GC/CHLAMYDIA PROBE AMP, GENITAL: Chlamydia, DNA Probe: NEGATIVE

## 2011-08-19 NOTE — ED Notes (Signed)
Labs and meds reviewed. Pt. adequately treated for Trich with Flagyl. Vassie Moselle 08/19/2011

## 2011-08-20 ENCOUNTER — Telehealth (HOSPITAL_COMMUNITY): Payer: Self-pay | Admitting: *Deleted

## 2011-12-12 ENCOUNTER — Other Ambulatory Visit: Payer: Self-pay | Admitting: Family Medicine

## 2011-12-12 ENCOUNTER — Other Ambulatory Visit (HOSPITAL_COMMUNITY)
Admission: RE | Admit: 2011-12-12 | Discharge: 2011-12-12 | Disposition: A | Discharge status: Home / Self Care | Payer: Medicaid Other | Source: Ambulatory Visit | Attending: Family Medicine | Admitting: Family Medicine

## 2011-12-12 DIAGNOSIS — Z01419 Encounter for gynecological examination (general) (routine) without abnormal findings: Secondary | ICD-10-CM | POA: Insufficient documentation

## 2011-12-17 ENCOUNTER — Other Ambulatory Visit (HOSPITAL_COMMUNITY): Payer: Self-pay | Admitting: Internal Medicine

## 2011-12-17 DIAGNOSIS — Z1231 Encounter for screening mammogram for malignant neoplasm of breast: Secondary | ICD-10-CM

## 2011-12-24 ENCOUNTER — Other Ambulatory Visit: Payer: Self-pay | Admitting: Internal Medicine

## 2011-12-24 ENCOUNTER — Ambulatory Visit (HOSPITAL_COMMUNITY)
Admission: RE | Admit: 2011-12-24 | Discharge: 2011-12-24 | Disposition: A | Discharge status: Home / Self Care | Payer: Medicaid Other | Source: Ambulatory Visit | Attending: Internal Medicine | Admitting: Internal Medicine

## 2011-12-24 DIAGNOSIS — M545 Low back pain, unspecified: Secondary | ICD-10-CM | POA: Insufficient documentation

## 2012-01-15 ENCOUNTER — Ambulatory Visit (HOSPITAL_COMMUNITY): Payer: Medicaid Other

## 2012-02-12 ENCOUNTER — Ambulatory Visit (HOSPITAL_COMMUNITY)
Admission: RE | Admit: 2012-02-12 | Discharge: 2012-02-12 | Disposition: A | Discharge status: Home / Self Care | Payer: Medicaid Other | Source: Ambulatory Visit | Attending: Internal Medicine | Admitting: Internal Medicine

## 2012-02-12 DIAGNOSIS — Z1231 Encounter for screening mammogram for malignant neoplasm of breast: Secondary | ICD-10-CM | POA: Insufficient documentation

## 2012-03-16 ENCOUNTER — Ambulatory Visit (HOSPITAL_COMMUNITY): Payer: Medicaid Other

## 2013-06-23 ENCOUNTER — Encounter (HOSPITAL_COMMUNITY): Payer: Self-pay | Admitting: *Deleted

## 2013-06-23 ENCOUNTER — Emergency Department (HOSPITAL_COMMUNITY)
Admission: EM | Admit: 2013-06-23 | Discharge: 2013-06-23 | Disposition: A | Discharge status: Home / Self Care | Payer: Medicaid Other | Source: Home / Self Care

## 2013-06-23 DIAGNOSIS — S239XXA Sprain of unspecified parts of thorax, initial encounter: Secondary | ICD-10-CM

## 2013-06-23 DIAGNOSIS — S46912A Strain of unspecified muscle, fascia and tendon at shoulder and upper arm level, left arm, initial encounter: Secondary | ICD-10-CM

## 2013-06-23 DIAGNOSIS — S2341XA Sprain of ribs, initial encounter: Secondary | ICD-10-CM

## 2013-06-23 DIAGNOSIS — S233XXA Sprain of ligaments of thoracic spine, initial encounter: Secondary | ICD-10-CM

## 2013-06-23 DIAGNOSIS — IMO0002 Reserved for concepts with insufficient information to code with codable children: Secondary | ICD-10-CM

## 2013-06-23 MED ORDER — KETOROLAC TROMETHAMINE 30 MG/ML IJ SOLN
INTRAMUSCULAR | Status: AC
  Filled 2013-06-23: qty 1

## 2013-06-23 MED ORDER — TRAMADOL HCL 50 MG PO TABS: 50.0000 mg | ORAL_TABLET | Freq: Four times a day (QID) | ORAL | Status: DC | PRN

## 2013-06-23 MED ORDER — KETOROLAC TROMETHAMINE 30 MG/ML IJ SOLN
30.0000 mg | Freq: Once | INTRAMUSCULAR | Status: AC
  Administered 2013-06-23: 30 mg via INTRAMUSCULAR

## 2013-06-23 MED ORDER — DICLOFENAC POTASSIUM 50 MG PO TABS: 50.0000 mg | ORAL_TABLET | Freq: Three times a day (TID) | ORAL | Status: DC

## 2013-06-23 NOTE — ED Notes (Signed)
Pt  Reports  Upper   Back  Pain    Since  This  Am          She    descibes  The  Pain as  A  Spasm  That is  Not  releived  By motrin

## 2013-06-23 NOTE — ED Provider Notes (Signed)
CSN: [id number]     Arrival date & time 06/23/13  1544 History   First MD Initiated Contact with Patient 06/23/13 1639     Chief Complaint  Patient presents with  . Back Pain   (Consider location/radiation/quality/duration/timing/severity/associated sxs/prior Treatment) HPI Comments: 47 year old patient presents with pain in the left back it radiates to the left anterior lateral chest. It began this morning around 8 AM. She slept on the couch last night. She is unaware of any activity or known injury that would've caused the pain. It is worse with taking a deep breath, movement, turning of the torso, forward flexion and other movements. Denies cough or shortness of breath.   Past Medical History  Diagnosis Date  . DVT (deep venous thrombosis)   . Anemia    Past Surgical History  Procedure Laterality Date  . Appendectomy     History reviewed. No pertinent family history. History  Substance Use Topics  . Smoking status: Current Every Day Smoker  . Smokeless tobacco: Not on file  . Alcohol Use: No   OB History   Grav Para Term Preterm Abortions TAB SAB Ect Mult Living                 Review of Systems  Constitutional: Negative for fever, chills and activity change.  HENT: Negative.   Respiratory: Negative.  Negative for cough, shortness of breath and wheezing.   Cardiovascular: Positive for chest pain.  Gastrointestinal: Negative.   Musculoskeletal:       As per HPI  Skin: Negative for color change, pallor and rash.  Neurological: Negative.     Allergies  Review of patient's allergies indicates no known allergies.  Home Medications   Current Outpatient Rx  Name  Route  Sig  Dispense  Refill  . diclofenac (CATAFLAM) 50 MG tablet   Oral   Take 1 tablet (50 mg total) by mouth 3 (three) times daily.   21 tablet   0   . traMADol (ULTRAM) 50 MG tablet   Oral   Take 1 tablet (50 mg total) by mouth every 6 (six) hours as needed for pain.   15 tablet   0    BP  110/56  Pulse 65  Temp(Src) 98.5 F (36.9 C) (Oral)  Resp 16  SpO2 100%  LMP 06/09/2013 Physical Exam  Nursing note and vitals reviewed. Constitutional: She is oriented to person, place, and time. She appears well-developed and well-nourished. No distress.  HENT:  Head: Normocephalic and atraumatic.  Eyes: EOM are normal. Pupils are equal, round, and reactive to light.  Neck: Normal range of motion. Neck supple.  Cardiovascular: Normal rate, regular rhythm and normal heart sounds.   Pulmonary/Chest: Effort normal and breath sounds normal. No respiratory distress. She has no wheezes. She has no rales.  Musculoskeletal:  Tenderness to the mid (thoracic muscular tear to include the rhombus in a portion of the trapezius. Tenderness tracking along the thoracic ribs left laterally to the need to reinstitute along the costal margin. No pain to the right chest or back.  Lymphadenopathy:    She has no cervical adenopathy.  Neurological: She is alert and oriented to person, place, and time. No cranial nerve deficit.  Skin: Skin is warm and dry.  Psychiatric: She has a normal mood and affect.    ED Course  Procedures (including critical care time) Labs Review Labs Reviewed - No data to display Imaging Review No results found.  MDM   1. Muscle strain  of scapular region, left, initial encounter   2. Thoracic sprain and strain, initial encounter   3. Sprain and strain of ribs, initial encounter      Heat to the sore areas. Limit mobility that causes pain. Continues to take deep breaths to cannulate the lungs well. Nicholos Johns date milligrams 3 times a day when necessary pain Alternatives milligrams Q4 to 6 hours when necessary pain.  Hayden Rasmussen, NP 06/23/13 [phone]

## 2013-06-24 NOTE — ED Provider Notes (Signed)
Medical screening examination/treatment/procedure(s) were performed by resident physician or non-physician practitioner and as supervising physician I was immediately available for consultation/collaboration.   Akiva Josey DOUGLAS MD.   Lilburn Straw D Glendora Clouatre, MD 06/24/13 0938 

## 2014-03-24 IMAGING — CR DG LUMBAR SPINE COMPLETE 4+V
5 series · 5 of 5 positions shown · non-contrast
Comparison: [HOSPITAL] CT abdomen pelvis dated 04/03/2010

CLINICAL DATA: Low back pain

LUMBAR SPINE - COMPLETE 4+ VIEW

[t lumbar spine ap]
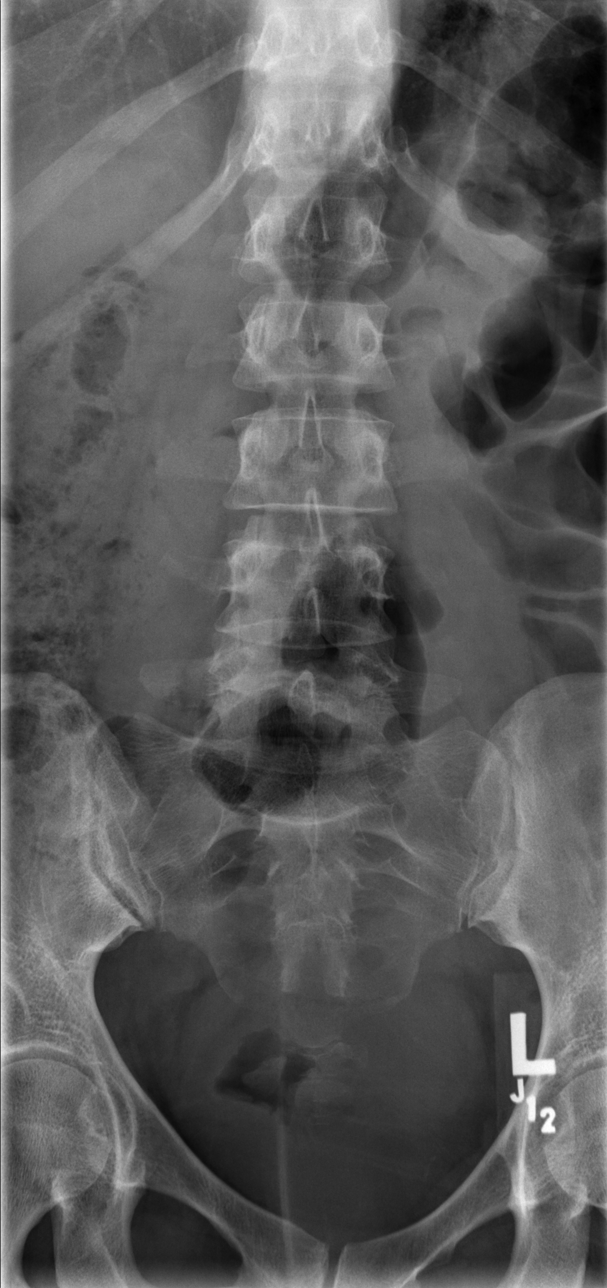

[t lumbar spine obl (1 of 2)]
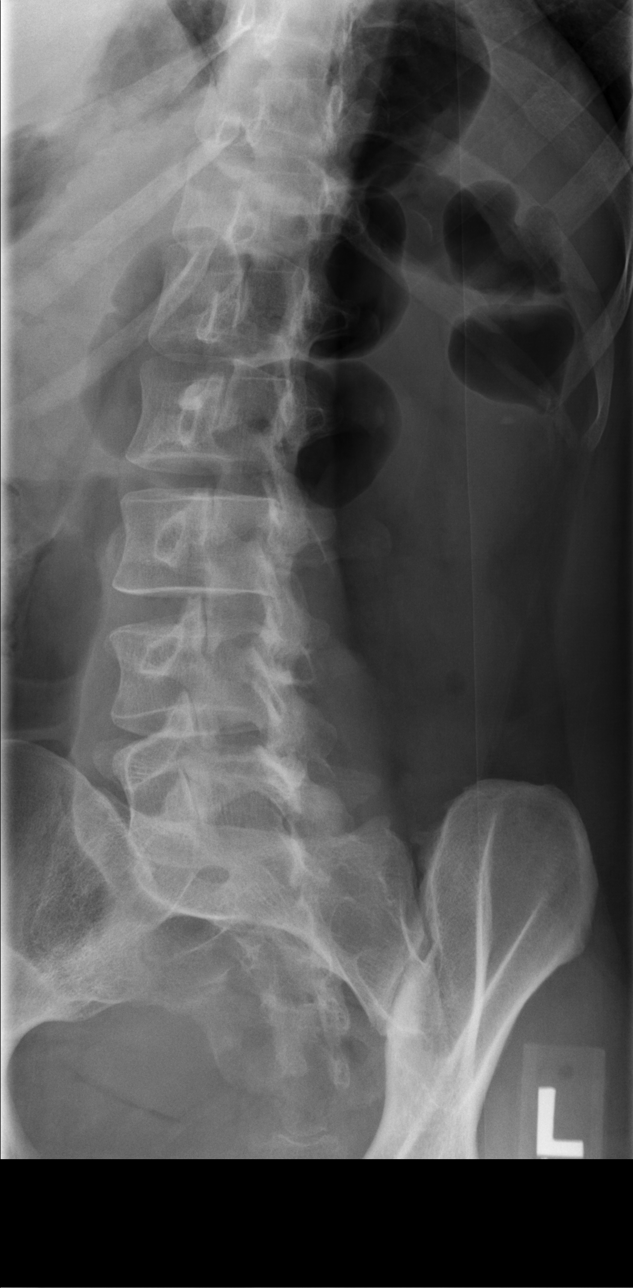

[t lumbar spine obl (2 of 2)]
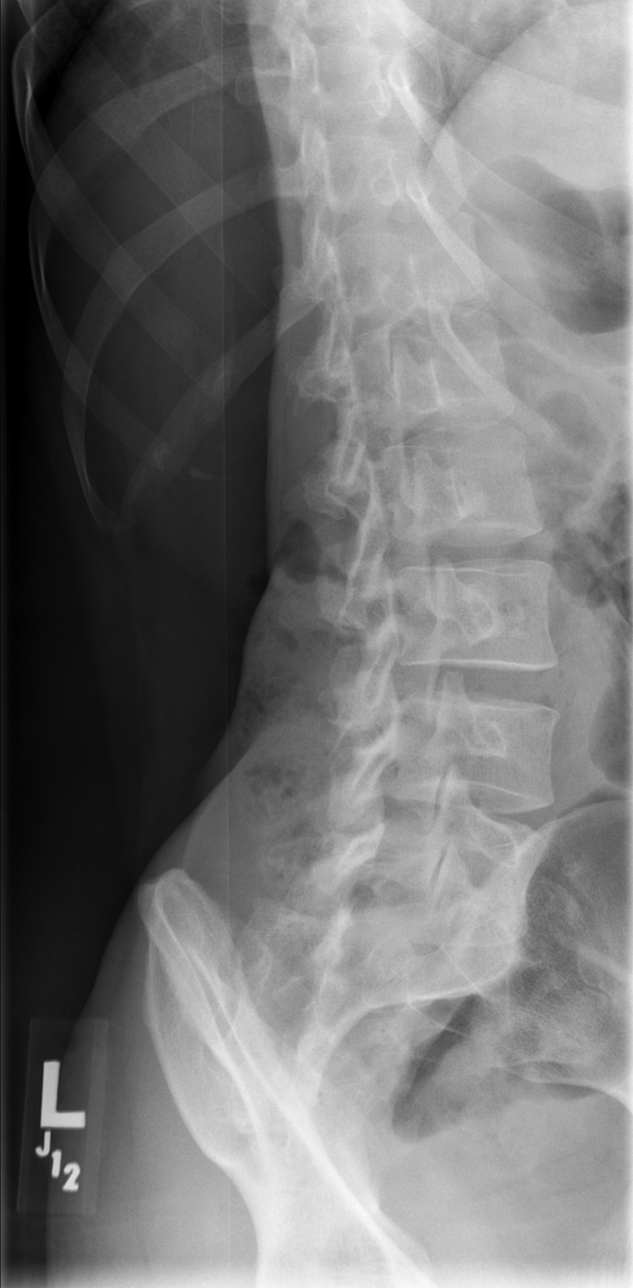

[t lumbar spine lat]
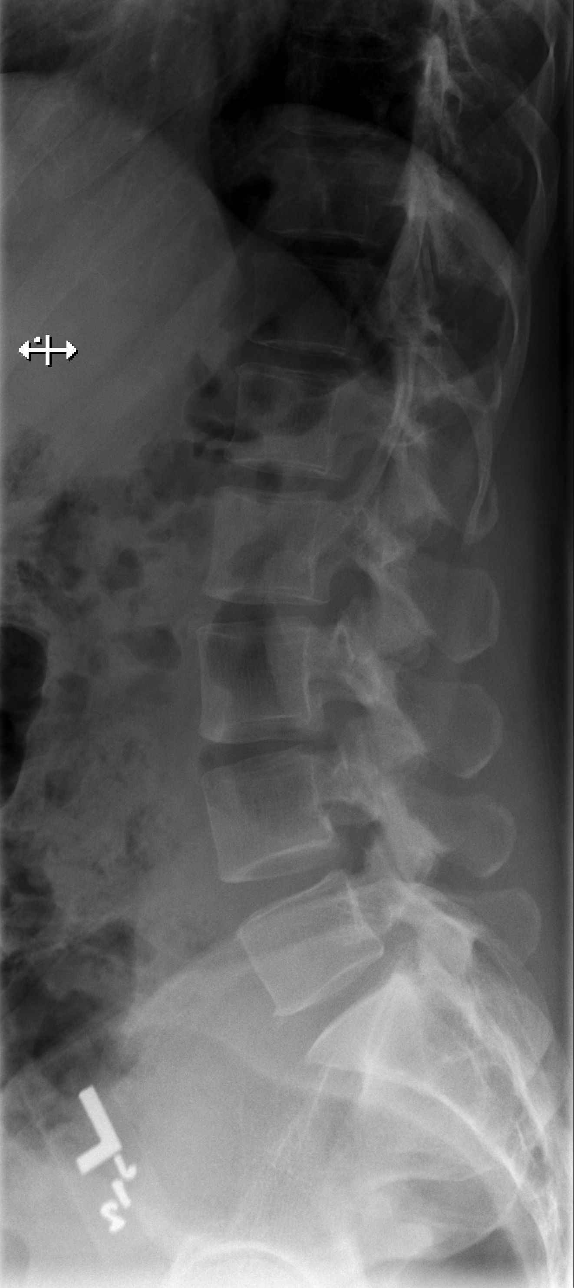

[t lumbar l-5 s-1 spot]
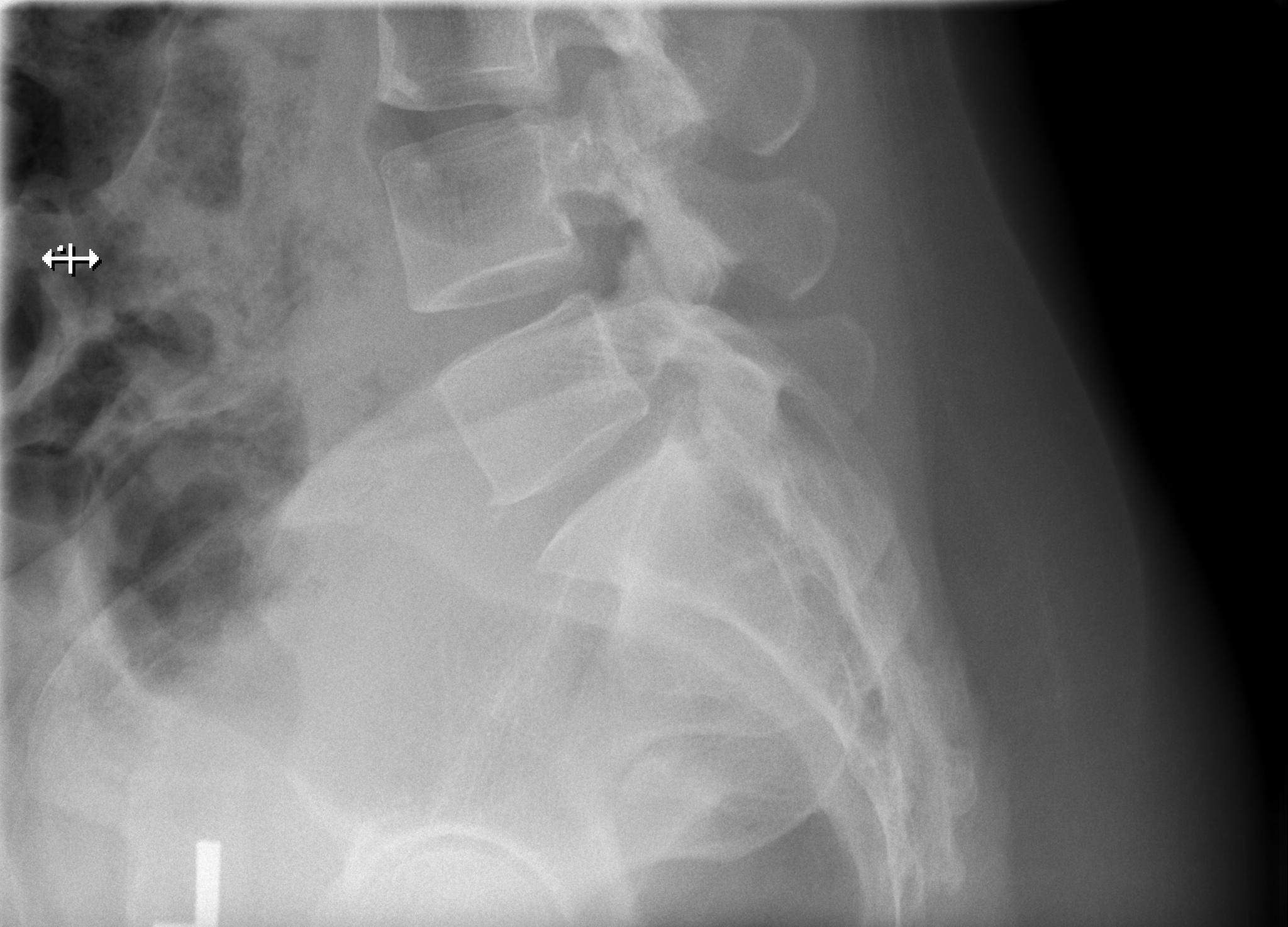

[5 of 5 positions shown; findings below may reference images not displayed]

FINDINGS: Five lumbar-type vertebral bodies.

Normal lumbar lordosis.

No evidence of fracture or dislocation.  Vertebral body heights are
maintained.

Mild degenerative changes at L5-S1.

The visualized bony pelvis appears intact.
IMPRESSION: No fracture or dislocation is seen.

Mild degenerative changes at L5-S1.

## 2015-02-15 ENCOUNTER — Emergency Department (HOSPITAL_COMMUNITY): Payer: Medicaid Other

## 2015-02-15 ENCOUNTER — Emergency Department (HOSPITAL_COMMUNITY)
Admission: EM | Admit: 2015-02-15 | Discharge: 2015-02-16 | Disposition: A | Discharge status: Home / Self Care | Payer: Medicaid Other | Attending: Emergency Medicine | Admitting: Emergency Medicine

## 2015-02-15 ENCOUNTER — Encounter (HOSPITAL_COMMUNITY): Payer: Self-pay | Admitting: *Deleted

## 2015-02-15 DIAGNOSIS — S0990XA Unspecified injury of head, initial encounter: Secondary | ICD-10-CM | POA: Insufficient documentation

## 2015-02-15 DIAGNOSIS — Z86718 Personal history of other venous thrombosis and embolism: Secondary | ICD-10-CM | POA: Insufficient documentation

## 2015-02-15 DIAGNOSIS — Z3202 Encounter for pregnancy test, result negative: Secondary | ICD-10-CM | POA: Diagnosis not present

## 2015-02-15 DIAGNOSIS — S0093XA Contusion of unspecified part of head, initial encounter: Secondary | ICD-10-CM | POA: Diagnosis not present

## 2015-02-15 DIAGNOSIS — S00212A Abrasion of left eyelid and periocular area, initial encounter: Secondary | ICD-10-CM | POA: Insufficient documentation

## 2015-02-15 DIAGNOSIS — Y999 Unspecified external cause status: Secondary | ICD-10-CM | POA: Insufficient documentation

## 2015-02-15 DIAGNOSIS — S3991XA Unspecified injury of abdomen, initial encounter: Secondary | ICD-10-CM | POA: Insufficient documentation

## 2015-02-15 DIAGNOSIS — H53149 Visual discomfort, unspecified: Secondary | ICD-10-CM | POA: Insufficient documentation

## 2015-02-15 DIAGNOSIS — N12 Tubulo-interstitial nephritis, not specified as acute or chronic: Secondary | ICD-10-CM | POA: Insufficient documentation

## 2015-02-15 DIAGNOSIS — T7421XA Adult sexual abuse, confirmed, initial encounter: Secondary | ICD-10-CM

## 2015-02-15 DIAGNOSIS — S0993XA Unspecified injury of face, initial encounter: Secondary | ICD-10-CM | POA: Diagnosis present

## 2015-02-15 DIAGNOSIS — S3992XA Unspecified injury of lower back, initial encounter: Secondary | ICD-10-CM | POA: Diagnosis not present

## 2015-02-15 DIAGNOSIS — Z23 Encounter for immunization: Secondary | ICD-10-CM | POA: Diagnosis not present

## 2015-02-15 DIAGNOSIS — R1084 Generalized abdominal pain: Secondary | ICD-10-CM

## 2015-02-15 DIAGNOSIS — Z720 Tobacco use: Secondary | ICD-10-CM | POA: Insufficient documentation

## 2015-02-15 DIAGNOSIS — Z862 Personal history of diseases of the blood and blood-forming organs and certain disorders involving the immune mechanism: Secondary | ICD-10-CM | POA: Diagnosis not present

## 2015-02-15 DIAGNOSIS — Y929 Unspecified place or not applicable: Secondary | ICD-10-CM | POA: Insufficient documentation

## 2015-02-15 DIAGNOSIS — Y939 Activity, unspecified: Secondary | ICD-10-CM | POA: Diagnosis not present

## 2015-02-15 LAB — URINALYSIS, ROUTINE W REFLEX MICROSCOPIC
Glucose, UA: NEGATIVE mg/dL
Ketones, ur: >80 mg/dL — AB
NITRITE: POSITIVE — AB
PH: 6 (ref 5.0–8.0)
Protein, ur: 100 mg/dL — AB
SPECIFIC GRAVITY, URINE: 1.017 (ref 1.005–1.030)
UROBILINOGEN UA: 1 mg/dL (ref 0.0–1.0)

## 2015-02-15 LAB — URINE MICROSCOPIC-ADD ON

## 2015-02-15 LAB — COMPREHENSIVE METABOLIC PANEL
ALBUMIN: 3.6 g/dL (ref 3.5–5.0)
ALK PHOS: 64 U/L (ref 38–126)
ALT: 36 U/L (ref 14–54)
ANION GAP: 9 (ref 5–15)
AST: 39 U/L (ref 15–41)
BILIRUBIN TOTAL: 0.9 mg/dL (ref 0.3–1.2)
CHLORIDE: 104 mmol/L (ref 101–111)
CO2: 22 mmol/L (ref 22–32)
Calcium: 9.1 mg/dL (ref 8.9–10.3)
Creatinine, Ser: 1.03 mg/dL — ABNORMAL HIGH (ref 0.44–1.00)
GFR calc Af Amer: >60 mL/min (ref >60)
GFR calc non Af Amer: >60 mL/min (ref >60)
Glucose, Bld: 121 mg/dL — ABNORMAL HIGH (ref 65–99)
POTASSIUM: 4 mmol/L (ref 3.5–5.1)
Sodium: 135 mmol/L (ref 135–145)
Total Protein: 7.7 g/dL (ref 6.5–8.1)

## 2015-02-15 LAB — CBC WITH DIFFERENTIAL/PLATELET
BASOS PCT: 0 % (ref 0–1)
Basophils Absolute: 0 10*3/uL (ref 0.0–0.1)
EOS ABS: 0 10*3/uL (ref 0.0–0.7)
Eosinophils Relative: 0 % (ref 0–5)
HCT: 35.2 % — ABNORMAL LOW (ref 36.0–46.0)
Hemoglobin: 11.9 g/dL — ABNORMAL LOW (ref 12.0–15.0)
LYMPHS ABS: 0.7 10*3/uL (ref 0.7–4.0)
Lymphocytes Relative: 7 % — ABNORMAL LOW (ref 12–46)
MCH: 27.9 pg (ref 26.0–34.0)
MCHC: 33.8 g/dL (ref 30.0–36.0)
MCV: 82.4 fL (ref 78.0–100.0)
MONO ABS: 0.8 10*3/uL (ref 0.1–1.0)
Monocytes Relative: 9 % (ref 3–12)
Neutro Abs: 7.7 10*3/uL (ref 1.7–7.7)
Neutrophils Relative %: 84 % — ABNORMAL HIGH (ref 43–77)
PLATELETS: 219 10*3/uL (ref 150–400)
RBC: 4.27 MIL/uL (ref 3.87–5.11)
RDW: 14.3 % (ref 11.5–15.5)
WBC: 9.2 10*3/uL (ref 4.0–10.5)

## 2015-02-15 LAB — POC URINE PREG, ED: Preg Test, Ur: NEGATIVE

## 2015-02-15 MED ORDER — DEXTROSE 5 % IV SOLN
1.0000 g | Freq: Once | INTRAVENOUS | Status: AC
  Administered 2015-02-15: 1 g via INTRAVENOUS
  Filled 2015-02-15: qty 10

## 2015-02-15 MED ORDER — IOHEXOL 300 MG/ML  SOLN
100.0000 mL | Freq: Once | INTRAMUSCULAR | Status: AC | PRN
  Administered 2015-02-15: 100 mL via INTRAVENOUS

## 2015-02-15 MED ORDER — SODIUM CHLORIDE 0.9 % IV BOLUS (SEPSIS)
1000.0000 mL | Freq: Once | INTRAVENOUS | Status: AC
  Administered 2015-02-15: 1000 mL via INTRAVENOUS

## 2015-02-15 MED ORDER — AZITHROMYCIN 250 MG PO TABS
1000.0000 mg | ORAL_TABLET | Freq: Once | ORAL | Status: AC
  Administered 2015-02-15: 1000 mg via ORAL
  Filled 2015-02-15: qty 4

## 2015-02-15 MED ORDER — MORPHINE SULFATE 4 MG/ML IJ SOLN
4.0000 mg | Freq: Once | INTRAMUSCULAR | Status: AC
  Administered 2015-02-15: 4 mg via INTRAVENOUS
  Filled 2015-02-15: qty 1

## 2015-02-15 MED ORDER — TETANUS-DIPHTH-ACELL PERTUSSIS 5-2.5-18.5 LF-MCG/0.5 IM SUSP
0.5000 mL | Freq: Once | INTRAMUSCULAR | Status: AC
  Administered 2015-02-15: 0.5 mL via INTRAMUSCULAR
  Filled 2015-02-15: qty 0.5
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
## 2015-02-16 LAB — WET PREP, GENITAL
Trich, Wet Prep: NONE SEEN
Yeast Wet Prep HPF POC: NONE SEEN

## 2015-02-16 LAB — RPR: RPR Ser Ql: NONREACTIVE

## 2015-02-16 LAB — GC/CHLAMYDIA PROBE AMP (~~LOC~~) NOT AT ARMC
Chlamydia: NEGATIVE
Neisseria Gonorrhea: NEGATIVE

## 2015-02-16 LAB — HIV ANTIBODY (ROUTINE TESTING W REFLEX): HIV Screen 4th Generation wRfx: NONREACTIVE

## 2015-02-16 MED ORDER — CEPHALEXIN 500 MG PO CAPS: 500.0000 mg | ORAL_CAPSULE | Freq: Four times a day (QID) | ORAL | Status: DC

## 2015-02-16 MED ORDER — KETOROLAC TROMETHAMINE 30 MG/ML IJ SOLN
30.0000 mg | Freq: Once | INTRAMUSCULAR | Status: AC
  Administered 2015-02-16: 30 mg via INTRAVENOUS
  Filled 2015-02-16: qty 1

## 2015-02-16 MED ORDER — TRAMADOL HCL 50 MG PO TABS: 50.0000 mg | ORAL_TABLET | Freq: Four times a day (QID) | ORAL | Status: DC | PRN

## 2015-02-16 NOTE — Discharge Instructions (Signed)
You may take Ibuprofen (Motrin) every 6-8 hours for fever and pain  Alternate with Tylenol  You may take Tylenol every 4-6 hours as needed for fever and pain  Follow-up with your primary care provider next week for recheck of symptoms if not improving.  Be sure to drink plenty of fluids and rest, at least 8hrs of sleep a night, preferably more while you are sick. Return to the ED if you cannot keep down fluids/signs of dehydration, fever not reducing with Tylenol, difficulty breathing/wheezing, stiff neck, worsening condition, or other concerns (see below)    Abdominal Pain, Women Abdominal (stomach, pelvic, or belly) pain can be caused by many things. It is important to tell your doctor:  The location of the pain.  Does it come and go or is it present all the time?  Are there things that start the pain (eating certain foods, exercise)?  Are there other symptoms associated with the pain (fever, nausea, vomiting, diarrhea)? All of this is helpful to know when trying to find the cause of the pain. CAUSES   Stomach: virus or bacteria infection, or ulcer.  Intestine: appendicitis (inflamed appendix), regional ileitis (Crohn's disease), ulcerative colitis (inflamed colon), irritable bowel syndrome, diverticulitis (inflamed diverticulum of the colon), or cancer of the stomach or intestine.  Gallbladder disease or stones in the gallbladder.  Kidney disease, kidney stones, or infection.  Pancreas infection or cancer.  Fibromyalgia (pain disorder).  Diseases of the female organs:  Uterus: fibroid (non-cancerous) tumors or infection.  Fallopian tubes: infection or tubal pregnancy.  Ovary: cysts or tumors.  Pelvic adhesions (scar tissue).  Endometriosis (uterus lining tissue growing in the pelvis and on the pelvic organs).  Pelvic congestion syndrome (female organs filling up with blood just before the menstrual period).  Pain with the menstrual period.  Pain with ovulation  (producing an egg).  Pain with an IUD (intrauterine device, birth control) in the uterus.  Cancer of the female organs.  Functional pain (pain not caused by a disease, may improve without treatment).  Psychological pain.  Depression. DIAGNOSIS  Your doctor will decide the seriousness of your pain by doing an examination.  Blood tests.  X-rays.  Ultrasound.  CT scan (computed tomography, special type of X-ray).  MRI (magnetic resonance imaging).  Cultures, for infection.  Barium enema (dye inserted in the large intestine, to better view it with X-rays).  Colonoscopy (looking in intestine with a lighted tube).  Laparoscopy (minor surgery, looking in abdomen with a lighted tube).  Major abdominal exploratory surgery (looking in abdomen with a large incision). TREATMENT  The treatment will depend on the cause of the pain.   Many cases can be observed and treated at home.  Over-the-counter medicines recommended by your caregiver.  Prescription medicine.  Antibiotics, for infection.  Birth control pills, for painful periods or for ovulation pain.  Hormone treatment, for endometriosis.  Nerve blocking injections.  Physical therapy.  Antidepressants.  Counseling with a psychologist or psychiatrist.  Minor or major surgery. HOME CARE INSTRUCTIONS   Do not take laxatives, unless directed by your caregiver.  Take over-the-counter pain medicine only if ordered by your caregiver. Do not take aspirin because it can cause an upset stomach or bleeding.  Try a clear liquid diet (broth or water) as ordered by your caregiver. Slowly move to a bland diet, as tolerated, if the pain is related to the stomach or intestine.  Have a thermometer and take your temperature several times a day, and record it.  Bed rest and sleep, if it helps the pain.  Avoid sexual intercourse, if it causes pain.  Avoid stressful situations.  Keep your follow-up appointments and tests, as  your caregiver orders.  If the pain does not go away with medicine or surgery, you may try:  Acupuncture.  Relaxation exercises (yoga, meditation).  Group therapy.  Counseling. SEEK MEDICAL CARE IF:   You notice certain foods cause stomach pain.  Your home care treatment is not helping your pain.  You need stronger pain medicine.  You want your IUD removed.  You feel faint or lightheaded.  You develop nausea and vomiting.  You develop a rash.  You are having side effects or an allergy to your medicine. SEEK IMMEDIATE MEDICAL CARE IF:   Your pain does not go away or gets worse.  You have a fever.  Your pain is felt only in portions of the abdomen. The right side could possibly be appendicitis. The left lower portion of the abdomen could be colitis or diverticulitis.  You are passing blood in your stools (bright red or black tarry stools, with or without vomiting).  You have blood in your urine.  You develop chills, with or without a fever.  You pass out. MAKE SURE YOU:   Understand these instructions.  Will watch your condition.  Will get help right away if you are not doing well or get worse. Document Released: 07/13/2007 Document Revised: 01/30/2014 Document Reviewed: 08/02/2009 [facility] Patient Information 2015 [place], [place]. This information is not intended to replace advice given to you by your health care provider. Make sure you discuss any questions you have with your health care provider.
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
## 2015-02-18 LAB — URINE CULTURE: Colony Count: >=100000

## 2015-02-21 ENCOUNTER — Telehealth: Payer: Self-pay | Admitting: *Deleted

## 2015-02-21 NOTE — ED Notes (Signed)
(+)  urine culture, treated with Cephalexin, OK per J. Frens, Pharm 

## 2016-07-10 ENCOUNTER — Emergency Department (HOSPITAL_COMMUNITY)
Admission: EM | Admit: 2016-07-10 | Discharge: 2016-07-10 | Disposition: A | Discharge status: Home / Self Care | Payer: Medicaid Other | Attending: Emergency Medicine | Admitting: Emergency Medicine

## 2016-07-10 ENCOUNTER — Encounter (HOSPITAL_COMMUNITY): Payer: Self-pay | Admitting: Emergency Medicine

## 2016-07-10 DIAGNOSIS — F17210 Nicotine dependence, cigarettes, uncomplicated: Secondary | ICD-10-CM | POA: Insufficient documentation

## 2016-07-10 DIAGNOSIS — M79671 Pain in right foot: Secondary | ICD-10-CM | POA: Diagnosis present

## 2016-07-10 NOTE — ED Triage Notes (Signed)
Pt has pain in right heel and bottom of foot for the last 3 months worse with walking. No redness or swelling. Denies any injury

## 2016-07-10 NOTE — Discharge Instructions (Signed)
You can stretch the foot and calf several times a day Use a frozen water bottle and roll your foot over it several times a day Use shoes with support, try to not walk barefoot Take Ibuprofen (Advil)  three times daily for the next 2 weeks Follow up with Orthopedics if symptoms are not getting better

## 2016-07-10 NOTE — ED Notes (Signed)
Patient reports worsening pain in the bottom of her right foot that occurs only when she is standing. Has progressed over the past 1-2 months.

## 2016-07-10 NOTE — ED Provider Notes (Signed)
[facility]-EMERGENCY DEPT Provider Note   CSN: [id number] Arrival date & time: 07/10/16  1128   By signing my name below, I, Molly Goodman, attest that this documentation has been prepared under the direction and in the presence of Molly HartKelly Dewie Ahart, PA-C. Electronically Signed: Teofilo PodMatthew P. Goodman, ED Scribe. 07/10/2016. 12:06 PM.   History   Chief Complaint Chief Complaint  Patient presents with  . Foot Pain    The history is provided by the patient. No language interpreter was used.   HPI Comments:  Molly Goodman is a 50 y.o. female with PMHx of DVT and anemia who presents to the Emergency Department complaining of constant right foot pain x 2 months. Pt states that the pain is primarily on the bottom of her right foot toward the heel. Pt rates the pain at >10/10. Pt complains of associated right ankle pain. Pt is ambulatory, but notes that the pain is exacerbated when walking and worsens in the morning and at night. Pt has been taking Advil twice daily with no relief. Pt does not have an orthopaedic specialist currently. Pt denies injury/trauma. Pt also denies calf pain, left foot pain, fever.  Past Medical History:  Diagnosis Date  . Anemia   . DVT (deep venous thrombosis) [facility](HCC)     Patient Active Problem List   Diagnosis Date Noted  . VAGINITIS, BACTERIAL 09/19/2010  . OBESITY 02/26/2010  . LEUKOPENIA, MILD 02/26/2010  . DEPRESSION, MILD 02/26/2010  . IRREGULAR MENSES 02/26/2010  . GENITAL HERPES 12/19/2009  . DRUG ABUSE 12/19/2009  . ADJUSTMENT DISORDER WITH ANXIOUS MOOD 07/10/2008  . FREQUENCY, URINARY 07/10/2008  . ANEMIA 05/11/2008  . DEEP VENOUS THROMBOPHLEBITIS, HX OF 05/11/2008  . PULMONARY EMBOLISM, HX OF 04/25/2008    Past Surgical History:  Procedure Laterality Date  . APPENDECTOMY      OB History    No data available       Home Medications    Prior to Admission medications   Medication Sig Start Date End Date Taking? Authorizing Provider    cephALEXin (KEFLEX) 500 MG capsule Take 1 capsule (500 mg total) by mouth 4 (four) times daily. 02/16/15   Molly FinnerErin O'Malley, PA-C  diclofenac (CATAFLAM) 50 MG tablet Take 1 tablet (50 mg total) by mouth 3 (three) times daily. Patient not taking: Reported on 02/15/2015 06/23/13   Molly Rasmussenavid Mabe, NP  ibuprofen (ADVIL,MOTRIN) 800 MG tablet Take 800 mg by mouth every 8 (eight) hours as needed for mild pain.    Historical Provider, MD  traMADol (ULTRAM) 50 MG tablet Take 1 tablet (50 mg total) by mouth every 6 (six) hours as needed for pain. Patient not taking: Reported on 02/15/2015 06/23/13   Molly Rasmussenavid Mabe, NP  traMADol (ULTRAM) 50 MG tablet Take 1 tablet (50 mg total) by mouth every 6 (six) hours as needed. 02/16/15   Molly FinnerErin O'Malley, PA-C    Family History No family history on file.  Social History Social History  Substance Use Topics  . Smoking status: Current Every Day Smoker    Packs/day: 0.50    Types: Cigarettes  . Smokeless tobacco: Not on file  . Alcohol use No     Allergies   Review of patient's allergies indicates no known allergies.   Review of Systems Review of Systems  Constitutional: Negative for fever.  Musculoskeletal: Positive for gait problem and myalgias.       Pain in plantar aspect of foot. No calf pain     Physical Exam Updated Vital Signs  BP 117/71 (BP Location: Right Arm)   Pulse 66   Temp 98.7 F (37.1 C) (Oral)   Resp 16   Ht 5\' 9"  (1.753 m)   Wt 180 lb (81.6 kg)   SpO2 100%   BMI 26.58 kg/m   Physical Exam  Constitutional: She appears well-developed and well-nourished. No distress.  HENT:  Head: Normocephalic and atraumatic.  Eyes: Conjunctivae are normal.  Cardiovascular: Normal rate.   Pulmonary/Chest: Effort normal.  Abdominal: She exhibits no distension.  Musculoskeletal:  Right ankle and foot: No obvious swelling or deformity. Tenderness to palpation of plantar insertion and Achilles tendon. FROM of ankle and toes. N/V intact.  Neurological: She  is alert.  Skin: Skin is warm and dry.  Psychiatric: She has a normal mood and affect.  Nursing note and vitals reviewed.    ED Treatments / Results  DIAGNOSTIC STUDIES:  Oxygen Saturation is 100% on RA, normal by my interpretation.    COORDINATION OF CARE:  12:06 PM Will refer to an orthopaedic specialist, recommended ibuprofen and at-home stretching exercises. Discussed treatment plan with pt at bedside and pt agreed to plan.   Labs (all labs ordered are listed, but only abnormal results are displayed) Labs Reviewed - No data to display  EKG  EKG Interpretation None       Radiology No results found.  Procedures Procedures (including critical care time)  Medications Ordered in ED Medications - No data to display   Initial Impression / Assessment and Plan / ED Course  I have reviewed the triage vital signs and the nursing notes.  Pertinent labs & imaging results that were available during my care of the patient were reviewed by me and considered in my medical decision making (see chart for details).  Clinical Course   50 year old female presents with symptoms consistent with plantar fascitis and achilles tendonitis. Imaging not indicated since there is no acute injury. Advised icing with frozen water bottle, NSAIDs, supportive shoes, ortho follow up. Patient is NAD, non-toxic, with stable VS. Patient is informed of clinical course, understands medical decision making process, and agrees with plan. Opportunity for questions provided and all questions answered. Return precautions given.   Final Clinical Impressions(s) / ED Diagnoses   Final diagnoses:  Right foot pain    New Prescriptions New Prescriptions   No medications on file   I personally performed the services described in this documentation, which was scribed in my presence. The recorded information has been reviewed and is accurate.     Molly Born, PA-C 07/10/16 1455    Rolland Porter,  MD 07/18/16 [phone]

## 2016-08-14 ENCOUNTER — Ambulatory Visit (INDEPENDENT_AMBULATORY_CARE_PROVIDER_SITE_OTHER): Payer: Medicaid Other

## 2016-08-14 ENCOUNTER — Encounter: Payer: Self-pay | Admitting: Podiatry

## 2016-08-14 ENCOUNTER — Ambulatory Visit (INDEPENDENT_AMBULATORY_CARE_PROVIDER_SITE_OTHER): Payer: Medicaid Other | Admitting: Podiatry

## 2016-08-14 VITALS — BP 124/84 | HR 70 | Resp 16

## 2016-08-14 DIAGNOSIS — M722 Plantar fascial fibromatosis: Secondary | ICD-10-CM

## 2016-08-14 DIAGNOSIS — M79671 Pain in right foot: Secondary | ICD-10-CM

## 2016-08-14 MED ORDER — TRIAMCINOLONE ACETONIDE 10 MG/ML IJ SUSP
10.0000 mg | Freq: Once | INTRAMUSCULAR | Status: AC
Start: 2016-08-14 — End: ?

## 2016-08-14 NOTE — Progress Notes (Signed)
   Subjective:    Patient ID: Molly Goodman, female    DOB: 07/24/1966, 50 y.o.   MRN: [id number]  HPI Chief Complaint  Patient presents with  . Foot Pain    Plantar heel right - aching for about 3 months, AM pain, went to ER 1 month ago and tried stretching instructions but hasn't helped      Review of Systems  Constitutional: Positive for activity change, diaphoresis and fatigue.  Cardiovascular: Positive for leg swelling.  Genitourinary: Positive for difficulty urinating and frequency.  Musculoskeletal: Positive for back pain and gait problem.  Hematological: Bruises/bleeds easily.  All other systems reviewed and are negative.      Objective:   Physical Exam        Assessment & Plan:

## 2016-08-14 NOTE — Patient Instructions (Signed)

## 2016-08-14 NOTE — Progress Notes (Signed)
Subjective:     Patient ID: Molly NiemannPriscilla A Vandermeer, female   DOB: 16-Feb-1966, 50 y.o.   MRN: [id number]  HPI patient states she felt a lot of pain in the plantar aspect of the right heel and it makes it difficult for her to walk at times. It's been present for around 3 months   Review of Systems  All other systems reviewed and are negative.      Objective:   Physical Exam  Constitutional: She is oriented to person, place, and time.  Cardiovascular: Intact distal pulses.   Musculoskeletal: Normal range of motion.  Neurological: She is oriented to person, place, and time.  Skin: Skin is warm.  Vitals reviewed.  neurovascular status intact muscle strength adequate range of motion within normal limits with patient noted to have exquisite discomfort plantar aspect right heel at the insertional point tendon the calcaneus with inflammation fluid around the medial band. Patient's noted to have good digital perfusion and is well oriented 3     Assessment:     Inflammatory fasciitis right heel at the insertional point tendon calcaneus    Plan:     H&P x-ray reviewed condition discussed. At this point I injected the right plantar fashion 3 Milligan Kenalog 5 mg Xylocaine and applied fascial brace and instructed on physical therapy and reappoint to recheck and may require some other type of therapy depending on response  X-ray report indicated small spur with no indication stress fracture arthritis

## 2021-09-27 ENCOUNTER — Emergency Department (HOSPITAL_BASED_OUTPATIENT_CLINIC_OR_DEPARTMENT_OTHER): Payer: Medicaid - Out of State

## 2021-09-27 ENCOUNTER — Other Ambulatory Visit: Payer: Self-pay

## 2021-09-27 ENCOUNTER — Emergency Department (HOSPITAL_BASED_OUTPATIENT_CLINIC_OR_DEPARTMENT_OTHER)
Admission: EM | Admit: 2021-09-27 | Discharge: 2021-09-27 | Disposition: A | Discharge status: Home / Self Care | Payer: Medicaid - Out of State | Attending: Emergency Medicine | Admitting: Emergency Medicine

## 2021-09-27 ENCOUNTER — Encounter (HOSPITAL_BASED_OUTPATIENT_CLINIC_OR_DEPARTMENT_OTHER): Payer: Self-pay | Admitting: Emergency Medicine

## 2021-09-27 DIAGNOSIS — Z20822 Contact with and (suspected) exposure to covid-19: Secondary | ICD-10-CM | POA: Insufficient documentation

## 2021-09-27 DIAGNOSIS — R051 Acute cough: Secondary | ICD-10-CM | POA: Insufficient documentation

## 2021-09-27 DIAGNOSIS — Z87891 Personal history of nicotine dependence: Secondary | ICD-10-CM | POA: Insufficient documentation

## 2021-09-27 LAB — RESP PANEL BY RT-PCR (FLU A&B, COVID) ARPGX2
Influenza A by PCR: NEGATIVE
Influenza B by PCR: NEGATIVE
SARS Coronavirus 2 by RT PCR: NEGATIVE

## 2021-09-27 MED ORDER — PREDNISONE 20 MG PO TABS: 40.0000 mg | ORAL_TABLET | Freq: Every day | ORAL | 0 refills | Status: AC

## 2021-09-27 MED ORDER — ALBUTEROL SULFATE HFA 108 (90 BASE) MCG/ACT IN AERS
2.0000 | INHALATION_SPRAY | Freq: Once | RESPIRATORY_TRACT | Status: AC
  Administered 2021-09-27: 22:00:00 2 via RESPIRATORY_TRACT
  Filled 2021-09-27: qty 6.7

## 2021-09-27 MED ORDER — AEROCHAMBER PLUS FLO-VU LARGE MISC
1.0000 | Freq: Once | Status: AC
  Administered 2021-09-27: 23:00:00 1
  Filled 2021-09-27: qty 1

## 2021-09-27 MED ORDER — BENZONATATE 100 MG PO CAPS
100.0000 mg | ORAL_CAPSULE | Freq: Once | ORAL | Status: AC
  Administered 2021-09-27: 22:00:00 100 mg via ORAL
  Filled 2021-09-27: qty 1

## 2021-09-27 MED ORDER — PREDNISONE 50 MG PO TABS
60.0000 mg | ORAL_TABLET | Freq: Once | ORAL | Status: AC
  Administered 2021-09-27: 22:00:00 60 mg via ORAL
  Filled 2021-09-27: qty 1

## 2021-09-27 MED ORDER — BENZONATATE 100 MG PO CAPS: 100.0000 mg | ORAL_CAPSULE | Freq: Three times a day (TID) | ORAL | 0 refills | Status: AC | PRN

## 2021-09-27 NOTE — Discharge Instructions (Addendum)
You came to the emergency department today to be evaluated for your cough.  Your chest x-ray showed no signs of pneumonia.  You were negative for COVID-19 and influenza.  Your cough is likely due to a viral infection and should improve over time.  I have given you prescription for prednisone, Tessalon and NSAIDs you home with an albuterol inhaler.  You may use 2 puffs of the inhaler every 4-6 hours as needed for shortness of breath.  Some common side effects of steroids include feelings of extra energy, feeling warm, increased appetite, and stomach upset.  If you are diabetic your sugars may run higher than usual.  If your symptoms do not improve after completing your steroids please follow-up with your primary care provider or urgent care for repeat assessment.  Get help right away if: You cough up blood. You have difficulty breathing. Your heartbeat is very fast.

## 2021-09-27 NOTE — ED Notes (Signed)
RT at bedside.

## 2021-09-27 NOTE — ED Notes (Signed)
Discharge instructions discussed with pt. Pt verbalized understanding. Pt stable and ambulatory.  °

## 2021-09-27 NOTE — ED Provider Notes (Signed)
[facility] EMERGENCY DEPARTMENT Provider Note   CSN: [id number] Arrival date & time: 09/27/21  1517     History Chief Complaint  Patient presents with   Cough    Molly Goodman is a 55 y.o. female with past medical history of anemia and DVT.  Presents emergency department with a chief complaint of cough.  Patient reports that cough has been present for almost 2 weeks.  States that cough is producing dark green to yellow mucus.  Patient has been having some improvement in symptoms with NyQuil cold and flu and Mucinex.  Denies any known aggravating factors for her cough.  Denies any known sick contacts.  Patient denies any fever, chills, rhinorrhea, nasal congestion, sore throat, shortness of breath, chest pain, abdominal pain, nausea, vomiting, diarrhea.   Cough Cough characteristics:  Productive Sputum characteristics:  Yellow and green Severity:  Unable to specify Onset quality:  Unable to specify Duration:  14 days Timing:  Intermittent Progression:  Unchanged Chronicity:  New Relieved by:  Cough suppressants Associated symptoms: no chest pain, no chills, no fever, no headaches, no rash, no rhinorrhea, no shortness of breath and no sore throat       Past Medical History:  Diagnosis Date   Anemia    DVT (deep venous thrombosis) [facility])     Patient Active Problem List   Diagnosis Date Noted   VAGINITIS, BACTERIAL 09/19/2010   OBESITY 02/26/2010   LEUKOPENIA, MILD 02/26/2010   DEPRESSION, MILD 02/26/2010   IRREGULAR MENSES 02/26/2010   GENITAL HERPES 12/19/2009   DRUG ABUSE 12/19/2009   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     No family history on file.  Social History    Tobacco Use   Smoking status: Former  Substance Use Topics   Alcohol use: No   Drug use: No    Home Medications Prior to Admission medications   Medication Sig Start Date End Date Taking? Authorizing Provider  ibuprofen (ADVIL,MOTRIN) 800 MG tablet Take 800 mg by mouth every 8 (eight) hours as needed for mild pain.    [provider]    Allergies    Patient has no known allergies.  Review of Systems   Review of Systems  Constitutional:  Negative for chills and fever.  HENT:  Negative for drooling, rhinorrhea and sore throat.   Eyes:  Negative for visual disturbance.  Respiratory:  Positive for cough. Negative for shortness of breath.   Cardiovascular:  Negative for chest pain.  Gastrointestinal:  Negative for abdominal pain, diarrhea, nausea and vomiting.  Musculoskeletal:  Negative for back pain and neck pain.  Skin:  Negative for color change and rash.  Neurological:  Negative for dizziness, syncope, light-headedness and headaches.  Psychiatric/Behavioral:  Negative for confusion.    Physical Exam Updated Vital Signs BP 122/78 (BP Location: Right Arm)    Pulse 88    Temp 98.7 F (37.1 C) (Oral)    Resp 18    Ht 5' 8.5" (1.74 m)    Wt 108.9 kg    LMP 02/14/2015    SpO2 98%    BMI 35.96 kg/m   Physical Exam Vitals and nursing note reviewed.  Constitutional:  General: She is not in acute distress.    Appearance: She is not ill-appearing, toxic-appearing or diaphoretic.  HENT:     Head: Normocephalic.  Eyes:     General: No scleral icterus.       Right eye: No discharge.        Left eye: No discharge.  Cardiovascular:     Rate and Rhythm: Normal rate.     Heart sounds: Normal heart sounds.  Pulmonary:     Effort: Pulmonary effort is normal. No tachypnea, bradypnea or respiratory distress.     Breath sounds: No stridor.     Comments: Coarse lung sounds to upper left lobe.  No wheezing or rales.  Patient speaks in full complete sentences without  difficulty. Abdominal:     Palpations: Abdomen is soft.     Tenderness: There is no abdominal tenderness.  Musculoskeletal:     Cervical back: Neck supple.  Skin:    General: Skin is warm and dry.  Neurological:     General: No focal deficit present.     Mental Status: She is alert.     GCS: GCS eye subscore is 4. GCS verbal subscore is 5. GCS motor subscore is 6.  Psychiatric:        Behavior: Behavior is cooperative.    ED Results / Procedures / Treatments   Labs (all labs ordered are listed, but only abnormal results are displayed) Labs Reviewed  RESP PANEL BY RT-PCR (FLU A&B, COVID) [id number]    EKG None  Radiology DG Chest 2 View  Result Date: 09/27/2021 CLINICAL DATA:  Cough. EXAM: CHEST - 2 VIEW COMPARISON:  Feb 15, 2015. FINDINGS: The heart size and mediastinal contours are within normal limits. Right lung is clear. Left lower lobe subsegmental atelectasis is noted. The visualized skeletal structures are unremarkable. IMPRESSION: Left lower lobe subsegmental atelectasis. Electronically Signed   By: Marijo Conception M.D.   On: 09/27/2021 16:29    Procedures Procedures   Medications Ordered in ED Medications  predniSONE (DELTASONE) tablet 60 mg (60 mg Oral Given 09/27/21 2214)  albuterol (VENTOLIN HFA) 108 (90 Base) MCG/ACT inhaler 2 puff (2 puffs Inhalation Given 09/27/21 2218)  AeroChamber Plus Flo-Vu Large MISC 1 each (1 each Other Given by Other 09/27/21 2247)  benzonatate (TESSALON) capsule 100 mg (100 mg Oral Given 09/27/21 2214)    ED Course  I have reviewed the triage vital signs and the nursing notes.  Pertinent labs & imaging results that were available during my care of the patient were reviewed by me and considered in my medical decision making (see chart for details).    MDM Rules/Calculators/A&P                          Alert 55 year old female no acute distress, nontoxic-appearing.  Presents emergency department with a chief complaint of  cough.  On physical exam patient has coarse lung sounds to left upper lobe.  Chest x-ray and influenza/COVID-19 testing obtained.  All imaging and lab results were independently reviewed by myself.  Chest x-ray shows no active cardiopulmonary disease.  Respiratory panel negative for COVID-19 and influenza.  Due to patient's persistent cough x2 weeks concern for possible bronchitis.  We will give patient prednisone, albuterol inhaler, and Tessalon in the emergency department.  Patient to take albuterol inhaler and spacer home with her.  Will prescribe patient with short course of prednisone and Tessalon.  Patient advised to follow-up with PCP  if symptoms do not improve. Discussed results, findings, treatment and follow up. Patient advised of return precautions. Patient verbalized understanding and agreed with plan.      Final Clinical Impression(s) / ED Diagnoses Final diagnoses:  Acute cough    Rx / DC Orders ED Discharge Orders          Ordered    benzonatate (TESSALON) 100 MG capsule  Every 8 hours PRN        09/27/21 2207    predniSONE (DELTASONE) 20 MG tablet  Daily        09/27/21 2207             Loni Beckwith, PA-C 09/28/21 0207    Jeanell Sparrow, DO 09/28/21 1436

## 2021-09-27 NOTE — ED Triage Notes (Signed)
Pt c/o cough and congestion x week and a half; negative home Covid test

## 2024-08-27 NOTE — ED Provider Notes (Signed)
 [facility]  Emergency Department Service Report    Molly Goodman 58 y.o. female , presents with Flank Pain    Triage   Arrived on 08/27/2024 at 5:16 AM   Arrived by BLS [13]    ED Triage Vitals   Temp Temp Source BP Heart Rate Resp SpO2 O2 Device Pain Score Weight   08/27/24 0530 08/27/24 0530 08/27/24 0530 08/27/24 0530 08/27/24 0530 08/27/24 0530 -- 08/27/24 0530 08/27/24 0533   36.8 ?C (98.2 ?F) Oral 124/71 71 16 100 %  Nine 79.4 kg (175 lb)       Pre hospital care:       No Known Allergies      Initial Physician Contact       Initial Contact Completed?: Yes (08/27/24 0524)    History   HPI  58 year old female history of neuroendocrine tumor, chronic headaches and chest pain presents with chest pain for the past several months, worse over the last week.  Patient states take Norco for her pain, states it was not helping this evening.  Otherwise denies any shortness of breath, abdominal pain, nausea, vomiting or diarrhea.  States she had a biopsy at that site in June of this year and has had chest pain worsening since then.  Otherwise denies any recent trauma, falls or any other new symptoms.              Language Assistance     Language Resource Used?: Patient declined                   Past Medical History:   Diagnosis Date    History of blood clots         Past Surgical History:   Procedure Laterality Date    APPENDECTOMY          Past Family History   Family history reviewed by me and there is no pertinent past family history related to the patient's current case and/or care.             Past Social History   she reports that she has never smoked. She has never used smokeless tobacco. She reports that she does not use drugs. No history on file for alcohol use and sexual activity.       Physical Exam   Physical Exam  ?Constitutional: Well appearing, no acute distress  Eyes: No conjunctival injection, no scleral icterus  HENT: External nose and ears atraumatic, mucous membranes moist  Neck: Supple, trachea midline  Cardiovascular: RRR, no murmurs/rubs/gallops, no cyanosis  Pulmonary/Chest: Breathing comfortably on room air, equal bilateral chest rise, CTAB, no crackles/wheezes/rhonchi,+ left chest wall tenderness overall biopsy site  Abdominal: Non-distended, soft, non-tender, no rebound/guarding  Musculoskeletal: Atraumatic, extremities without gross deformity, no lower extremity edema bilaterally  Neurologic: A/Ox3, no facial droop, moving all extremities willfully, answering questions appropriately   Skin: Warm & dry  Psychiatric: Normal affect & behavior    Triage vitals noted            ED Course     ED Course as of 08/27/24 0640   Sat Aug 27, 2024   0602 XR chest ap portable (1 view)  No acute cardiopulmonary disease [EW]   0605 EKG   HR: 62, NSR, no ST/TW changes, no evidence of Brugada, HOCM, prolonged QT, WPW, or ARVD  Interpreted by: Azzie CROME. Molly Goodman   [EW]      ED Course User Index  [EW] Maribeth Azzie CROME.,  MD                  Laboratory Results   Labs Reviewed - No data to display    Imaging Results     XR chest ap portable (1 view)   Final Result by Goodman Castleman, MD (11/29 0559)   IMPRESSION:        No acute cardiopulmonary disease.            Signed by: Molly Goodman   08/27/2024 5:59 AM          Administered Medications     Medication Administration from 08/27/2024 0516 to 08/27/2024 9359         Date/Time Order Dose Route Action Action by Comments     08/27/2024 0546 PST morphine  4 mg/mL inj 4 mg 4 mg IV Push Given Goncalves, John Paul, RN --     08/27/2024 0545 PST ondansetron  4 mg/2 mL inj 4 mg 4 mg Intravenous Given Goncalves, John Paul, RN --            morphine  4 mg/mL inj 4 mg, morphine  4 mg/mL inj 4 mg is a High-risk medication as it is a parenteral controlled substance.          Procedures     Procedures     Medical Decision Making   Molly Goodman is a 58 y.o. female presents with chest pain, similar symptoms when she was evaluated in the emergency room last month.  Had negative cardiac workup including negative troponin.  Patient was similar symptoms today likely chronic from her prior biopsy.  Will provide pain medication, obtain chest x-ray to evaluate for any evidence of localized infection, pneumothorax or any other abnormal pathology.  Will continue monitor closely and if workup unremarkable will likely discharge with outpatient follow up.  Otherwise low suspicion for acute coronary syndrome or myocardial infarction given prior negative workups and symptoms are constant and reproducible with tenderness on exam.      Patient presents to the emergency room for the following:   Flank Pain       I personally reviewed the vital signs which are notable for the following:   Vitals:    08/27/24 0530 08/27/24 0533   BP: 124/71    Patient Position: Sitting    Pulse: 71    Resp: 16    Temp: 36.8 ?C (98.2 ?F)    TempSrc: Oral    SpO2: 100%    Weight:  79.4 kg (175 lb)   Height:  1.753 m (5' 9'')        Frequent reassessment performed throughout patient's ED visit including monitoring of patient's vital signs, reexamination, and/or clinical response to interventions.    I personally and independently reviewed patient's test results, which are notable for the following:  Stable EKG from prior no acute ST segment changes, no ischemia  I personally and independently reviewed patient's imaging, which is notable for the following:  No acute cardiopulmonary abnormalities, no pneumothorax or pneumonia    Prior records were reviewed and were notable for:  History of neuroendocrine tumor, burning chest and head pain        Shared decision-making was used when appropriate.  History obtained using interpreter via telephone or at bedside, if language barrier with present.        Medical Decision Making  Amount and/or Complexity of Data Reviewed  External Data Reviewed: radiology and notes.  Labs: ordered. Decision-making details documented in ED Course.  Radiology:  ordered. Decision-making details documented in ED Course.  ECG/medicine tests: ordered. Decision-making details documented in ED Course.    Risk  Prescription drug management.             Clinical Impression           Chest pain, unspecified type (Primary)  Costochondritis      Prescriptions     New Prescriptions    No medications on file         Disposition and Follow-up   Disposition:  Discharge [1] 08/27/2024  6:37 AM    No future appointments.    Follow up with:  No follow-up provider specified.    Return precautions are specified on After Visit Summary.      Portions of this chart may have been created with 37M Fluency Direct Mobile Microphone voice recognition software. Occasional wrong-word or ?sound-alike? substitutions may have occurred due to the inherent limitations of voice recognition software. Please read the chart carefully and recognize, using context, where these substitutions have occurred.                             Maribeth Azzie CROME., MD  08/27/24 [phone]

## 2024-09-21 ENCOUNTER — Other Ambulatory Visit: Payer: Self-pay

## 2024-09-21 ENCOUNTER — Encounter (HOSPITAL_COMMUNITY): Payer: Self-pay | Admitting: Emergency Medicine

## 2024-09-21 ENCOUNTER — Emergency Department (HOSPITAL_COMMUNITY): Payer: Medicaid - Out of State

## 2024-09-21 ENCOUNTER — Emergency Department (HOSPITAL_COMMUNITY)
Admission: EM | Admit: 2024-09-21 | Discharge: 2024-09-21 | Disposition: A | Discharge status: Home / Self Care | Payer: Medicaid - Out of State | Attending: Emergency Medicine | Admitting: Emergency Medicine

## 2024-09-21 DIAGNOSIS — R519 Headache, unspecified: Secondary | ICD-10-CM | POA: Insufficient documentation

## 2024-09-21 DIAGNOSIS — Z7901 Long term (current) use of anticoagulants: Secondary | ICD-10-CM | POA: Insufficient documentation

## 2024-09-21 DIAGNOSIS — M542 Cervicalgia: Secondary | ICD-10-CM | POA: Diagnosis not present

## 2024-09-21 DIAGNOSIS — I82409 Acute embolism and thrombosis of unspecified deep veins of unspecified lower extremity: Secondary | ICD-10-CM | POA: Insufficient documentation

## 2024-09-21 DIAGNOSIS — G8929 Other chronic pain: Secondary | ICD-10-CM | POA: Insufficient documentation

## 2024-09-21 LAB — CBC WITH DIFFERENTIAL/PLATELET
Abs Immature Granulocytes: 0.02 K/uL (ref 0.00–0.07)
Basophils Absolute: 0.1 K/uL (ref 0.0–0.1)
Basophils Relative: 1 %
Eosinophils Absolute: 0.4 K/uL (ref 0.0–0.5)
Eosinophils Relative: 6 %
HCT: 34.5 % — ABNORMAL LOW (ref 36.0–46.0)
Hemoglobin: 11.5 g/dL — ABNORMAL LOW (ref 12.0–15.0)
Immature Granulocytes: 0 %
Lymphocytes Relative: 40 %
Lymphs Abs: 2.6 K/uL (ref 0.7–4.0)
MCH: 28.6 pg (ref 26.0–34.0)
MCHC: 33.3 g/dL (ref 30.0–36.0)
MCV: 85.8 fL (ref 80.0–100.0)
Monocytes Absolute: 0.4 K/uL (ref 0.1–1.0)
Monocytes Relative: 7 %
Neutro Abs: 2.9 K/uL (ref 1.7–7.7)
Neutrophils Relative %: 46 %
Platelets: 300 K/uL (ref 150–400)
RBC: 4.02 MIL/uL (ref 3.87–5.11)
RDW: 14.6 % (ref 11.5–15.5)
WBC: 6.4 K/uL (ref 4.0–10.5)
nRBC: 0 % (ref 0.0–0.2)

## 2024-09-21 LAB — COMPREHENSIVE METABOLIC PANEL WITH GFR
ALT: 42 U/L (ref 0–44)
AST: 34 U/L (ref 15–41)
Albumin: 4.5 g/dL (ref 3.5–5.0)
Alkaline Phosphatase: 131 U/L — ABNORMAL HIGH (ref 38–126)
Anion gap: 10 (ref 5–15)
BUN: 13 mg/dL (ref 6–20)
CO2: 23 mmol/L (ref 22–32)
Calcium: 9.9 mg/dL (ref 8.9–10.3)
Chloride: 107 mmol/L (ref 98–111)
Creatinine, Ser: 0.47 mg/dL (ref 0.44–1.00)
GFR, Estimated: >60 mL/min
Glucose, Bld: 125 mg/dL — ABNORMAL HIGH (ref 70–99)
Potassium: 4.2 mmol/L (ref 3.5–5.1)
Sodium: 139 mmol/L (ref 135–145)
Total Bilirubin: 0.3 mg/dL (ref 0.0–1.2)
Total Protein: 7.8 g/dL (ref 6.5–8.1)

## 2024-09-21 MED ORDER — OXYCODONE HCL 5 MG PO TABS
10.0000 mg | ORAL_TABLET | Freq: Once | ORAL | Status: AC
  Administered 2024-09-21: 10 mg via ORAL
  Filled 2024-09-21: qty 2

## 2024-09-21 MED ORDER — DROPERIDOL 2.5 MG/ML IJ SOLN
1.2500 mg | Freq: Once | INTRAMUSCULAR | Status: AC
  Administered 2024-09-21: 1.25 mg via INTRAVENOUS
  Filled 2024-09-21: qty 2

## 2024-09-21 NOTE — ED Provider Notes (Signed)
 Received signout.  See prior team's note for full HPI.  Clinical Course as of 09/21/24 1535  Wed Sep 21, 2024  0751 Received signout; pending meds and re-eval.  [TY]  1012 Patient reevaluated.  Reports improvement of her headache but still having some neck discomfort.  Is on chronic narcotics.  She does feel improved enough to go home but requesting a dose before she leaves.  Will discharge in stable condition. [TY]    Clinical Course User Index [TY] Neysa Caron PARAS, DO      Neysa Caron PARAS, [place] 09/21/24 1535

## 2024-09-21 NOTE — ED Provider Notes (Signed)
 " [facility] EMERGENCY DEPARTMENT AT [facility] Provider Note   CSN: [id number] Arrival date & time: 09/21/24  0124     Patient presents with: Neck Pain, headache, and Headache   Molly Goodman is a 58 y.o. female.   The history is provided by the patient and medical records.  Neck Pain Associated symptoms: headaches   Headache Associated symptoms: neck pain   Molly Goodman is a 58 y.o. female who presents to the Emergency Department complaining of headache.  She presents to the emergency department for evaluation of left-sided headache that started several weeks ago.  She does have a history of chronic headaches but states this is different.  Pain is located on the backside of her left head and neck.  Pain is described as both throbbing and sharp.  She reports poor sleep secondary to the pain and the pain goes to the left side of her neck.  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  She also has a history of DVT and takes Eliquis.  She is currently visiting family in the [place] area and traveled here by plane.  No reported injuries.  No fever, chest pain, numbness, weakness.     Prior to Admission medications  Medication Sig Start Date End Date Taking? Authorizing Provider  benzonatate  (TESSALON ) 100 MG capsule Take 1 capsule (100 mg total) by mouth every 8 (eight) hours as needed for cough. 09/27/21   Badalamente, Peter R, PA-C  ibuprofen (ADVIL,MOTRIN) 800 MG tablet Take 800 mg by mouth every 8 (eight) hours as needed for mild pain.    [provider]    Allergies: Patient has no known allergies.    Review of Systems  Musculoskeletal:  Positive for neck pain.  Neurological:  Positive for headaches.  All other systems reviewed and are negative.   Updated Vital Signs BP 123/79 (BP Location: Right Arm)   Pulse 62   Temp 98.2 F (36.8 C) (Oral)   Resp 16   LMP 02/14/2015    SpO2 98%   Physical Exam Vitals and nursing note reviewed.  Constitutional:      Appearance: She is well-developed.  HENT:     Head: Normocephalic and atraumatic.  Cardiovascular:     Rate and Rhythm: Normal rate and regular rhythm.     Heart sounds: No murmur heard. Pulmonary:     Effort: Pulmonary effort is normal. No respiratory distress.     Breath sounds: Normal breath sounds.  Abdominal:     Palpations: Abdomen is soft.     Tenderness: There is no abdominal tenderness. There is no guarding or rebound.  Musculoskeletal:        General: No tenderness.     Cervical back: Neck supple. No tenderness.  Skin:    General: Skin is warm and dry.  Neurological:     Mental Status: She is alert and oriented to person, place, and time.     Comments: No asymmetry of facial movements.  5 out of 5 strength in all 4 extremities with sensation to light touch intact in all 4 extremities  Psychiatric:        Behavior: Behavior normal.     (all labs ordered are listed, but only abnormal results are displayed) Labs Reviewed  CBC WITH DIFFERENTIAL/PLATELET - Abnormal; Notable for the following components:      Result Value   Hemoglobin 11.5 (*)    HCT 34.5 (*)  All other components within normal limits  COMPREHENSIVE METABOLIC PANEL WITH GFR - Abnormal; Notable for the following components:   Glucose, Bld 125 (*)    Alkaline Phosphatase 131 (*)    All other components within normal limits    EKG: None  Radiology: CT Head Wo Contrast Result Date: 09/21/2024 EXAM: CT HEAD WITHOUT CONTRAST 09/21/2024 07:10:56 AM TECHNIQUE: CT of the head was performed without the administration of intravenous contrast. Automated exposure control, iterative reconstruction, and/or weight based adjustment of the mA/kV was utilized to reduce the radiation dose to as low as reasonably achievable. COMPARISON: CT head 02/15/2015. CLINICAL HISTORY: 58 year old female with new onset headache for 2 weeks, on  Eliquis, history of lesion on brain that needs surgery. FINDINGS: BRAIN AND VENTRICLES: No acute hemorrhage. No evidence of acute infarct. No hydrocephalus. No extra-axial collection. No mass effect or midline shift. Brain volume remains normal for age. Gray white differentiation stable and normal for age. No evidence of dural thickening along the right frontal convexity. No suspicious intracranial vascular hyperdensity. Mild calcified atherosclerosis at the skull base. ORBITS: No acute abnormality. SINUSES: Paranasal sinuses, tympanic cavities and mastoids are clear. SOFT TISSUES AND SKULL: Right superior frontal bone 2.5 cm lesion of stellate granularity and lucency (series 4 image 38) is new since 2016. CT appearance favors a benign vascular lesion of bones such as hemangioma. There is some associated scalp soft tissue thickening on series 4 image 39. Elsewhere the calvarium appears intact and bone mineralization is within normal limits. No skull fracture. IMPRESSION: 1. Since 2016, a right superior frontal bone skull lesion has developed (2.5 cm), and does appear associated with mild overlying scalp soft tissue thickening. 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 2. Normal for age non contrast CT appearance of the brain. Electronically signed by: Helayne Hurst MD 09/21/2024 07:19 AM EST RP Workstation: [id number]     Procedures   Medications Ordered in the ED  droperidol  (INAPSINE ) 2.5 MG/ML injection 1.25 mg (has no administration in time range)                                   Medical Decision Making Amount and/or Complexity of Data Reviewed Labs: ordered. Radiology: ordered.  Risk Prescription drug management.   Patient with history of neuroendocrine tumor on on Sandostatin therapy, DVT on anticoagulation here for evaluation of acute on chronic headache.  She is nontoxic-appearing on evaluation with no focal neurologic  deficits.  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  Current picture is not consistent with meningitis, subarachnoid hemorrhage, dural sinus thrombosis, dissection.  Plan to treat patient's symptoms.  Patient care transferred pending meds and reevaluation.     Final diagnoses:  None    ED Discharge Orders     None          Griselda Norris, MD 09/21/24 [phone]  "

## 2024-09-21 NOTE — ED Triage Notes (Signed)
 Pt reports headache & neck pain x 3 weeks. Was seen at ED back home in [place] & they found lesion on brain that needs surgery.  Hx norephedrine tumor cancer.  Gets treatment Sandostatin once a month.

## 2024-09-21 NOTE — Discharge Instructions (Signed)
 Please follow-up with your primary doctors.  Return for fevers, chills, severe headache, vision loss, facial droop, unilateral weakness, numbness tingling changes in sensation extremities, seizures, chest pain, shortness of breath or any new or worsening symptoms that are concerning to you.
